# Patient Record
Sex: Female | Born: 1935 | Race: White | Hispanic: No | Marital: Married | Smoking: Never smoker
Health system: Southern US, Community
[De-identification: ages and names within clinical notes are randomized; demographics above are authoritative.]

## PROBLEM LIST (undated history)

## (undated) DIAGNOSIS — F450 Somatization disorder: Secondary | ICD-10-CM

## (undated) DIAGNOSIS — E7800 Pure hypercholesterolemia, unspecified: Secondary | ICD-10-CM

## (undated) DIAGNOSIS — H409 Unspecified glaucoma: Secondary | ICD-10-CM

## (undated) DIAGNOSIS — M940 Chondrocostal junction syndrome [Tietze]: Secondary | ICD-10-CM

## (undated) DIAGNOSIS — R053 Chronic cough: Secondary | ICD-10-CM

## (undated) DIAGNOSIS — I447 Left bundle-branch block, unspecified: Secondary | ICD-10-CM

## (undated) DIAGNOSIS — R05 Cough: Secondary | ICD-10-CM

## (undated) DIAGNOSIS — K219 Gastro-esophageal reflux disease without esophagitis: Secondary | ICD-10-CM

## (undated) DIAGNOSIS — E222 Syndrome of inappropriate secretion of antidiuretic hormone: Secondary | ICD-10-CM

## (undated) DIAGNOSIS — J309 Allergic rhinitis, unspecified: Secondary | ICD-10-CM

## (undated) DIAGNOSIS — F329 Major depressive disorder, single episode, unspecified: Secondary | ICD-10-CM

## (undated) DIAGNOSIS — I10 Essential (primary) hypertension: Secondary | ICD-10-CM

## (undated) DIAGNOSIS — M810 Age-related osteoporosis without current pathological fracture: Secondary | ICD-10-CM

## (undated) DIAGNOSIS — R413 Other amnesia: Secondary | ICD-10-CM

## (undated) HISTORY — PX: CATARACT EXTRACTION: SUR2

## (undated) HISTORY — DX: Other amnesia: R41.3

## (undated) HISTORY — DX: Somatization disorder: F45.0

## (undated) HISTORY — PX: ABDOMINAL SURGERY: SHX537

## (undated) HISTORY — DX: Allergic rhinitis, unspecified: J30.9

## (undated) HISTORY — DX: Chondrocostal junction syndrome (tietze): M94.0

## (undated) HISTORY — DX: Pure hypercholesterolemia, unspecified: E78.00

## (undated) HISTORY — DX: Syndrome of inappropriate secretion of antidiuretic hormone: E22.2

## (undated) HISTORY — DX: Chronic cough: R05.3

## (undated) HISTORY — DX: Gastro-esophageal reflux disease without esophagitis: K21.9

## (undated) HISTORY — DX: Major depressive disorder, single episode, unspecified: F32.9

## (undated) HISTORY — DX: Age-related osteoporosis without current pathological fracture: M81.0

## (undated) HISTORY — DX: Cough: R05

## (undated) HISTORY — DX: Left bundle-branch block, unspecified: I44.7

## (undated) HISTORY — PX: APPENDECTOMY: SHX54

---

## 1998-05-14 ENCOUNTER — Other Ambulatory Visit: Admission: RE | Admit: 1998-05-14 | Discharge: 1998-05-14 | Payer: Self-pay | Admitting: Geriatric Medicine

## 1999-05-30 ENCOUNTER — Encounter: Admission: RE | Admit: 1999-05-30 | Discharge: 1999-05-30 | Payer: Self-pay | Admitting: Geriatric Medicine

## 1999-05-30 ENCOUNTER — Encounter: Payer: Self-pay | Admitting: Geriatric Medicine

## 1999-07-03 ENCOUNTER — Encounter: Payer: Self-pay | Admitting: Geriatric Medicine

## 1999-07-03 ENCOUNTER — Encounter: Admission: RE | Admit: 1999-07-03 | Discharge: 1999-07-03 | Payer: Self-pay | Admitting: Geriatric Medicine

## 2000-01-05 ENCOUNTER — Encounter: Payer: Self-pay | Admitting: Gastroenterology

## 2000-01-05 ENCOUNTER — Ambulatory Visit (HOSPITAL_COMMUNITY): Admission: RE | Admit: 2000-01-05 | Discharge: 2000-01-05 | Payer: Self-pay | Admitting: Gastroenterology

## 2000-02-16 ENCOUNTER — Ambulatory Visit (HOSPITAL_COMMUNITY): Admission: RE | Admit: 2000-02-16 | Discharge: 2000-02-16 | Payer: Self-pay | Admitting: Gastroenterology

## 2000-08-16 ENCOUNTER — Inpatient Hospital Stay (HOSPITAL_COMMUNITY): Admission: RE | Admit: 2000-08-16 | Discharge: 2000-08-19 | Payer: Self-pay | Admitting: Surgery

## 2000-10-19 ENCOUNTER — Encounter: Payer: Self-pay | Admitting: Geriatric Medicine

## 2000-10-19 ENCOUNTER — Encounter: Admission: RE | Admit: 2000-10-19 | Discharge: 2000-10-19 | Payer: Self-pay | Admitting: Geriatric Medicine

## 2000-11-01 ENCOUNTER — Other Ambulatory Visit: Admission: RE | Admit: 2000-11-01 | Discharge: 2000-11-01 | Payer: Self-pay | Admitting: Geriatric Medicine

## 2000-11-08 ENCOUNTER — Encounter: Payer: Self-pay | Admitting: Surgery

## 2000-11-08 ENCOUNTER — Ambulatory Visit (HOSPITAL_COMMUNITY): Admission: RE | Admit: 2000-11-08 | Discharge: 2000-11-08 | Payer: Self-pay | Admitting: Surgery

## 2000-12-13 ENCOUNTER — Emergency Department (HOSPITAL_COMMUNITY): Admission: EM | Admit: 2000-12-13 | Discharge: 2000-12-13 | Payer: Self-pay | Admitting: Emergency Medicine

## 2001-05-25 ENCOUNTER — Ambulatory Visit (HOSPITAL_COMMUNITY): Admission: RE | Admit: 2001-05-25 | Discharge: 2001-05-25 | Payer: Self-pay | Admitting: Specialist

## 2001-06-20 ENCOUNTER — Ambulatory Visit (HOSPITAL_COMMUNITY): Admission: RE | Admit: 2001-06-20 | Discharge: 2001-06-20 | Payer: Self-pay | Admitting: Specialist

## 2001-09-06 ENCOUNTER — Encounter: Payer: Self-pay | Admitting: Emergency Medicine

## 2001-09-06 ENCOUNTER — Emergency Department (HOSPITAL_COMMUNITY): Admission: EM | Admit: 2001-09-06 | Discharge: 2001-09-06 | Payer: Self-pay | Admitting: Emergency Medicine

## 2001-09-15 ENCOUNTER — Emergency Department (HOSPITAL_COMMUNITY): Admission: EM | Admit: 2001-09-15 | Discharge: 2001-09-15 | Payer: Self-pay | Admitting: *Deleted

## 2001-10-31 ENCOUNTER — Encounter: Payer: Self-pay | Admitting: Geriatric Medicine

## 2001-10-31 ENCOUNTER — Encounter: Admission: RE | Admit: 2001-10-31 | Discharge: 2001-10-31 | Payer: Self-pay | Admitting: Geriatric Medicine

## 2002-01-03 HISTORY — PX: COLONOSCOPY: SHX174

## 2002-01-25 ENCOUNTER — Ambulatory Visit (HOSPITAL_COMMUNITY): Admission: RE | Admit: 2002-01-25 | Discharge: 2002-01-25 | Payer: Self-pay | Admitting: Gastroenterology

## 2002-11-02 ENCOUNTER — Encounter: Payer: Self-pay | Admitting: Geriatric Medicine

## 2002-11-02 ENCOUNTER — Encounter: Admission: RE | Admit: 2002-11-02 | Discharge: 2002-11-02 | Payer: Self-pay | Admitting: Geriatric Medicine

## 2003-03-28 ENCOUNTER — Encounter: Payer: Self-pay | Admitting: Emergency Medicine

## 2003-03-28 ENCOUNTER — Emergency Department (HOSPITAL_COMMUNITY): Admission: EM | Admit: 2003-03-28 | Discharge: 2003-03-28 | Payer: Self-pay | Admitting: Emergency Medicine

## 2003-09-23 ENCOUNTER — Emergency Department (HOSPITAL_COMMUNITY): Admission: EM | Admit: 2003-09-23 | Discharge: 2003-09-23 | Payer: Self-pay | Admitting: Emergency Medicine

## 2003-11-05 ENCOUNTER — Encounter: Admission: RE | Admit: 2003-11-05 | Discharge: 2003-11-05 | Payer: Self-pay | Admitting: Geriatric Medicine

## 2003-11-26 ENCOUNTER — Other Ambulatory Visit: Admission: RE | Admit: 2003-11-26 | Discharge: 2003-11-26 | Payer: Self-pay | Admitting: Geriatric Medicine

## 2004-01-15 ENCOUNTER — Ambulatory Visit (HOSPITAL_COMMUNITY): Admission: RE | Admit: 2004-01-15 | Discharge: 2004-01-15 | Payer: Self-pay | Admitting: Gastroenterology

## 2004-08-12 ENCOUNTER — Encounter: Admission: RE | Admit: 2004-08-12 | Discharge: 2004-08-12 | Payer: Self-pay | Admitting: Geriatric Medicine

## 2004-08-28 ENCOUNTER — Ambulatory Visit: Payer: Self-pay | Admitting: Internal Medicine

## 2004-09-11 ENCOUNTER — Ambulatory Visit: Payer: Self-pay | Admitting: Internal Medicine

## 2004-10-01 ENCOUNTER — Ambulatory Visit: Payer: Self-pay | Admitting: Internal Medicine

## 2004-10-21 ENCOUNTER — Encounter: Admission: RE | Admit: 2004-10-21 | Discharge: 2004-10-21 | Payer: Self-pay | Admitting: Gastroenterology

## 2004-10-30 ENCOUNTER — Ambulatory Visit (HOSPITAL_COMMUNITY): Admission: RE | Admit: 2004-10-30 | Discharge: 2004-10-30 | Payer: Self-pay | Admitting: Gastroenterology

## 2004-11-17 ENCOUNTER — Encounter: Admission: RE | Admit: 2004-11-17 | Discharge: 2004-11-17 | Payer: Self-pay | Admitting: Geriatric Medicine

## 2005-01-29 ENCOUNTER — Ambulatory Visit: Payer: Self-pay | Admitting: Internal Medicine

## 2005-07-25 ENCOUNTER — Encounter: Admission: RE | Admit: 2005-07-25 | Discharge: 2005-07-25 | Payer: Self-pay | Admitting: Geriatric Medicine

## 2005-11-18 ENCOUNTER — Encounter: Admission: RE | Admit: 2005-11-18 | Discharge: 2005-11-18 | Payer: Self-pay | Admitting: Geriatric Medicine

## 2007-01-14 ENCOUNTER — Encounter: Admission: RE | Admit: 2007-01-14 | Discharge: 2007-01-14 | Payer: Self-pay | Admitting: Geriatric Medicine

## 2009-04-05 HISTORY — PX: US ECHOCARDIOGRAPHY: HXRAD669

## 2010-09-15 ENCOUNTER — Other Ambulatory Visit: Payer: Self-pay | Admitting: Internal Medicine

## 2010-09-15 ENCOUNTER — Ambulatory Visit
Admission: RE | Admit: 2010-09-15 | Discharge: 2010-09-15 | Disposition: A | Discharge status: Home / Self Care | Payer: Medicare Other | Source: Ambulatory Visit | Attending: Internal Medicine | Admitting: Internal Medicine

## 2010-09-15 DIAGNOSIS — G44009 Cluster headache syndrome, unspecified, not intractable: Secondary | ICD-10-CM

## 2010-11-21 NOTE — Op Note (Signed)
NAME:  Natasha David, CIMINO NO.:  [id number]   MEDICAL RECORD NO.:  [id number]          PATIENT TYPE:  AMB   LOCATION:  ENDO                         FACILITY:  [facility]   PHYSICIAN:  Danise Edge, M.D.   DATE OF BIRTH:  04-Oct-1920   DATE OF PROCEDURE:  10/30/2004  DATE OF DISCHARGE:                                 OPERATIVE REPORT   PROCEDURE:  Esophagogastroduodenoscopy/Savary esophageal dilation.   PROCEDURE INDICATIONS:  Natasha David is an 77 year old female born  [birth year]. Natasha David underwent laparoscopic Nissen fundoplication  surgery in February 2002 to control gastroesophageal reflux. She requires no  antireflux medication and does not experience heartburn, dysphagia, or  odynophagia. When she developed a chronic cough which seemed to be worsened  with food intake, she underwent a barium swallowing study. She did not have  tracheal penetration. When she swallowed the barium tablet, it lodged at the  esophagogastric junction.   ENDOSCOPIST:  Danise Edge, M.D.   PREMEDICATION:  Versed 5 mg, Demerol 50 mg.   PROCEDURE:  After obtaining informed consent, [name] was placed in the  left lateral decubitus position on the fluoroscopy table. I administered  intravenous Demerol and intravenous Versed to achieve conscious sedation for  the procedure. The patient's blood pressure, oxygen saturation and cardiac  rhythm were monitored throughout the procedure and documented in the medical  record.   The Olympus gastroscope was passed through the posterior hypopharynx into  the proximal esophagus without difficulty. The hypopharynx, larynx and vocal  cords appeared normal.   Esophagoscopy: The proximal mid and lower segments of the esophageal mucosa  appeared normal. She appears to have a shallow stricture at the  esophagogastric junction which appears benign.   Gastroscopy: Retroflexed view of the gastric cardia and fundus was normal.  The  fundoplication is intact and tightly hugging the gastroscope. The  gastric fundus and body appeared normal. The gastric cardia and fundus  appeared normal. The gastric body, antrum and pylorus appeared normal.   Duodenoscopy: The duodenal bulb and descending duodenum appeared normal.   Savary esophageal dilation: The Savary wire was passed through the  gastroscope and tip of the guidewire advanced to the distal gastric antrum  as confirmed both endoscopically and fluoroscopically. Under fluoroscopic  guidance, the 15-mm Savary dilator passed without resistance. Repeat  esophagogastroscopy confirmed dilation of the benign peptic stricture at the  esophagogastric junction and no gastric trauma secondary to the wire.   ASSESSMENT:  1.  Intact laparoscopic Nissen fundoplication.  2.  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  3.  A 15-mm  Savary dilation of the benign peptic stricture at the      esophagogastric junction.   RECOMMENDATIONS:  I have no explanation for Natasha David's chronic cough. I do  not demonstrate that she has tracheal aspiration. I do not think she has  acid reflux as a cause for her chronic cough.   She is currently using Tussionex which seems  to be controlling her cough.      MJ/MEDQ  D:  10/30/2004  T:  10/30/2004  Job:  [id number]   cc:   Hal T. Stoneking, M.D.  [address]  [place], [place] [address]  Fax: [phone]   Clinton D. Maple Hudson, M.D.

## 2010-11-21 NOTE — Op Note (Signed)
[facility]  Patient:    Natasha David, Natasha David                   MRN: [id number] Proc. Date: 08/16/00 Attending:  Thornton Park. Daphine Deutscher, M.D. CC:         Hal T. Stoneking, M.D.  Charolett Bumpers III, M.D.   Operative Report  PREOPERATIVE DIAGNOSIS:  Gastroesophageal reflux disease.  POSTOPERATIVE DIAGNOSIS:  Gastroesophageal reflux disease.  OPERATION:  Laparoscopic Nissen fundoplication and closure of the crura.  SURGEON:  Thornton Park. Daphine Deutscher, M.D.  ASSISTANT:  Angelia Mould. Derrell Lolling, M.D.  ANESTHESIA:  General endotracheal anesthesia.  DESCRIPTION OF PROCEDURE:  Natasha David is a 77 year old lady who I had seen on numerous occasions and discussed laparoscopic as well as open Nissen fundoplication with her and her husband.  She has esophageal pain and a lot of problems with material refluxing and with coughing, particularly at night despite elevating the head of her bed.  Her heartburn seems to be well controlled with proton pump inhibitors.  Preoperatively we obtained a manometry, and then she also had an upper GI which did show a small hiatal hernia with some reflux.  She was taken to OR #1 and given general anesthesia.  The abdomen was prepped widely with Betadine and draped sterilely.  She had a perforated appendix with peritonitis and a bowel obstruction in the 1970s, and she had a wide paramedian incision.  This complicated our initial entry.  I made the supraumbilical incision just medial to part of her paramedian incision and went in bluntly, and, through a pursestring suture, passed the Hasson cannula and was able to insufflate the abdomen but was unable to get in with the angled scope.  With the abdomen insufflated, however, I used the 1011 and the 0 degree scope on the end to come into the right upper quadrant and enter the free space there.  I put a 5 mm in the upper midline which I subsequently used to try to retract the liver and then began  taking down some of these adhesions with the harmonic scalpel.  Between that trocar and a subsequent one in the left upper quadrant using the 1011, I was able to mobilize all these adhesions and free them all the way around down to well below the umbilicus.  She had a pretty significant inflammatory reaction in her lower abdomen from the surgery in the 1970s.  The left lateral segment was somewhat floppy and hanging down really lower. Her anatomy was complicated by a short distance from the umbilicus to the diaphragm, and the liver itself did not lend itself to easy retraction.  It also looked like she probably had a replaced left hepatic coming off the gastric which we preserved.  With the harmonic scalpel, I opened the gastrohepatic window.  I had to eventually use just the 5 mm retractor coming from above and even just used the trocar itself to retract the left lateral segment.  I carried this up and found a very acute angled wide crus which was attenuated.  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  I carried this anteriorly and then onto the left crus.  I went over and took down short gastrics and mobilized the cardia nicely and mobilized my retroesophageal window.  Again, the crus was found to be at a fairly acute angle, and I got around the GE junction, creating a nice window there.  I then ended up closing the esophageal crura with two stitches using the EndoStitch.  The left crus was right on top of the aorta and, again, I had to close it more on the patients right side just because of the angle but got two stitches to approximating and snugged down the crura somewhat.  Again, the attenuation of the right crus did not lend itself to as sturdy a closure as usual.  However, this was closed.  With that, then, we passed the 50 lighted dilator which passed easily.  I then had in the meantime reached around and gotten a  segment of cardia and fundus which came around easily.  This was sutured to a contiguous segment of the wrapped portion over on the left side and taking purchases of stomach and then esophagus and then stomach and tied down extracorporeally.  Three such knots were placed to secure the wrap.  Clips were placed on all the knots.  The bougie was removed.  The crus was snug, but it did not appear to be too tight.  There was no evidence of bleeding or any kind of enterotomies. Everything looked good.  The anatomy was returned to its anatomic position with the liver down on top of the GE junction.  I surveyed all the trocar holes and where I had taken down the adhesions, and there was no evidence of any enterotomies.  The umbilical port was tied down with the previously placed pursestring suture.  The other ports were withdrawn.  All the ports were injected with Marcaine, and the skin was closed with 4-0 Vicryl with Benzoin and Steri-Strips.  The patient seemed to tolerate the procedure well and was taken to the recovery room in satisfactory condition.DD:  08/16/00 TD:  08/16/00 Job: [id number]

## 2010-11-21 NOTE — Discharge Summary (Signed)
[facility]  Patient:    Natasha David, Natasha David                   MRN: [id number] Adm. Date:  [id number] Disc. Date: 08/19/00 Attending:  Katha Cabal CC:         Hal T. Stoneking, M.D.  Charolett Bumpers III, M.D.   Discharge Summary  ADMISSION DIAGNOSIS:  Gastroesophageal reflux disease.  PROCEDURE:  Laparoscopic Nissen fundoplication and closure of crura.  COURSE IN THE HOSPITAL:  Natasha David was admitted on August 16, 2000, and underwent a laparoscopic Nissen fundoplication over a #50 dilator with closure of the crura.  Postoperatively, she did well.  She was begun on water and then advanced to liquids.  She was ready for discharge on August 19, 2000.  DISCHARGE MEDICATIONS:  She was given a prescription for Mepergan Fortis to take for pain.  DIET:  She was given instruction in advancing her diet slowly from a soft diet gradually to a solid diet.  FOLLOW-UP:  She will be seen in the office in two to three weeks in follow-up.  CONDITION ON DISCHARGE:  Good. DD:  08/19/00 TD:  08/19/00 Job: [id number]

## 2010-11-21 NOTE — H&P (Signed)
[facility]  Patient:    Natasha David, Natasha David                   MRN: [id number] Adm. Date:  08/16/00 Attending:  Thornton Park. Daphine Deutscher, M.D.                         History and Physical  CHIEF COMPLAINT:  Chronic gastroesophageal reflux disease.  HISTORY OF PRESENT ILLNESS:  Natasha David is a 77 year old lady followed by Hal T. Pete Glatter, M.D. and ______  Laural Benes, M.D. with chronic gastroesophageal reflux and regurgitation. She has had a lot of problems with chronic cough, but her symptoms of heartburn are well controlled with her Nexium. Her chronic cough and water brash have been a problem even though she does sleep with the head of her bed elevated. I have discussed this with her and husband on numerous occasions. We did acquire an esophageal manometry which revealed adequate peristalsis preoperatively. She had an upper GI which showed a small hiatal hernia with some reflux and a slight prominence of the cricopharygeal muscle.  ALLERGIES:  PENICILLIN and ERYTHROMYCIN.  CURRENT MEDICATIONS:  Hydrochlorothiazide, Nexium, Norvasc 5 mg q.d., Miacalcin Nasal Spray, vitamin E, Cosopt glaucoma drops 10 cc b.i.d.  PAST SURGICAL HISTORY:  Appendicitis in 1974 with rupture and followed by intestinal obstruction in 1976.  MEDICATIONS PROBLEMS:  GERD, vertigo, osteoporosis, mitral valve prolapse, glaucoma, and hypertension.  SOCIAL HISTORY:  The patient does not smoke or drink.  FAMILY HISTORY:  Remarkable in that her father died of leukemia, and her mother died of a heart attack and also had diabetes.  REVIEW OF SYSTEMS:  Positive for glasses and glaucoma, some hearing loss, mitral valve prolapse, reflux, high cholesterol.  PHYSICAL EXAMINATION:  GENERAL:  An older-appearing lady.  HEENT:  Unremarkable.  NECK:  Supple.  CHEST:  Clear.  HEART:  Sinus rhythm  ABDOMEN:  Flat and there is a right paramedian incision with an old scar.  EXTREMITIES:   Full range of motion.  NEUROLOGICAL:  Alert and oriented x 3. Motor and sensory function grossly intact.  IMPRESSION:  Gastroesophageal reflux disease.  PLAN:  Laparoscopic Nissen fundoplication. DD:  08/16/00 TD:  08/16/00 Job: [id number]

## 2010-11-21 NOTE — Procedures (Signed)
[facility]  Patient:    Natasha David, Natasha David Visit Number: [id number] MRN: [id number]          Service Type: END Location: ENDO Attending Physician:  Dennison Bulla Ii Dictated by:   Verlin Grills, M.D. Proc. Date: 01/25/02 Admit Date:  01/25/2002 Discharge Date: 01/25/2002   CC:         Hal T. Stoneking, M.D.   Procedure Report  PROCEDURE:  Diagnostic colonoscopy.  REFERRING PHYSICIAN:  Hal T. Stoneking, M.D.  PROCEDURE INDICATION:  Natasha David is a 78 year old female born 05-Apr-1921.  Natasha David is scheduled to undergo a diagnostic colonoscopy to evaluate guaiac positive stool.  In 2002, she underwent a laparoscopic Nissen fundoplication.  I discussed with Natasha David the complications associated with colonoscopy and polypectomy, including a 15 per 1000 risk of bleeding and 4 per 1000 risk of colon perforation requiring surgical repair.  Natasha David has signed the operative permit.  ENDOSCOPIST:  Verlin Grills, M.D.  PREMEDICATION:  Versed 3.5 mg, Demerol 50 mg.  ENDOSCOPE:  Olympus pediatric colonoscope.  DESCRIPTION OF PROCEDURE:  After obtaining informed consent, Natasha David was placed in the left lateral decubitus position.  I administered intravenous Demerol and intravenous Versed to achieve conscious sedation for the procedure.  The patients blood pressure, oxygen saturation, and cardiac rhythm were monitored throughout the procedure and documented in the medical record.  Anal inspection was normal.  Digital rectal exam was normal.  The Olympus pediatric video colonoscope was introduced into the rectum and advanced to the cecum.  A normal-appearing ileocecal valve was intubated and the distal ileum inspected.  Colonic preparation for the exam today was excellent.  RECTUM:  Normal.  SIGMOID COLON AND DESCENDING COLON:  Normal.  SPLENIC FLEXURE:  Normal.  TRANSVERSE COLON:  Normal.  HEPATIC FLEXURE:   Normal.  ASCENDING COLON:  Normal.  CECUM AND ILEOCECAL VALVE:  Normal.  DISTAL ILEUM:  Normal.  ASSESSMENT:  Normal proctocolonoscopy to the cecum with distal ileoscopy. Dictated by:   Verlin Grills, M.D. Attending Physician:  Dennison Bulla Ii DD:  01/25/02 TD:  01/28/02 Job: [id number]

## 2010-11-21 NOTE — Procedures (Signed)
[name]  Patient:    Natasha David, Natasha David                         MRN: [id number] Proc. Date: 02/16/00 Attending:  Verlin Grills, M.D.                           Procedure Report  PROCEDURE PERFORMED:  Esophageal manometry.  ENDOSCOPIST:  Verlin Grills, M.D.  INDICATIONS FOR PROCEDURE:  The patient is a  77 year old female with gastroesophageal reflux symptoms (heartburn, chest pain), who is being evaluated for laparoscopic antireflux surgery.  She chronically takes Cosopt, hydrochlorothiazide, Norvasc, Protonix and miacalcin.  Lower esophageal sphincter:  The resting lower esophageal sphincter pressure was 48.6 mmHg which is in the upper limits of normal.  There is 89% relaxation for 9.5 seconds of the lower esophageal sphincter with swallows.  Lower esophageal body:  Data obtained from the lower esophageal body was measured based on 10 wet swallows.  The distal esophageal wave amplitude was 88 mmHg which is normal.  90% of postswallow esophageal contractions were peristaltic and 10% were retrograde contraction.  Upper esophageal body:  Five wet swallows resulted in 100% peristaltic contraction with normal wave amplitude.  IMPRESSION: Slightly high resting lower esophageal sphincter pressure with normal swallow associated relaxation.  Normal esophageal body function.  Based on esophageal manometry, Natasha David should be a satisfactory candidate for laparoscopic antireflux surgery. DD:  02/20/00 TD:  02/22/00 Job: [id number]

## 2011-02-11 ENCOUNTER — Other Ambulatory Visit: Payer: Self-pay | Admitting: Geriatric Medicine

## 2011-02-11 DIAGNOSIS — R51 Headache: Secondary | ICD-10-CM

## 2011-02-12 ENCOUNTER — Ambulatory Visit
Admission: RE | Admit: 2011-02-12 | Discharge: 2011-02-12 | Disposition: A | Discharge status: Home / Self Care | Payer: Medicare Other | Source: Ambulatory Visit | Attending: Geriatric Medicine | Admitting: Geriatric Medicine

## 2011-02-12 DIAGNOSIS — R51 Headache: Secondary | ICD-10-CM

## 2011-07-28 DIAGNOSIS — H02059 Trichiasis without entropian unspecified eye, unspecified eyelid: Secondary | ICD-10-CM | POA: Diagnosis not present

## 2011-07-28 DIAGNOSIS — H05229 Edema of unspecified orbit: Secondary | ICD-10-CM | POA: Diagnosis not present

## 2011-07-30 DIAGNOSIS — H02059 Trichiasis without entropian unspecified eye, unspecified eyelid: Secondary | ICD-10-CM | POA: Diagnosis not present

## 2011-08-06 DIAGNOSIS — I10 Essential (primary) hypertension: Secondary | ICD-10-CM | POA: Diagnosis not present

## 2011-08-06 DIAGNOSIS — R269 Unspecified abnormalities of gait and mobility: Secondary | ICD-10-CM | POA: Diagnosis not present

## 2011-08-11 ENCOUNTER — Ambulatory Visit: Payer: Medicare Other | Attending: Geriatric Medicine | Admitting: Physical Therapy

## 2011-08-11 DIAGNOSIS — M6281 Muscle weakness (generalized): Secondary | ICD-10-CM | POA: Diagnosis not present

## 2011-08-11 DIAGNOSIS — R5381 Other malaise: Secondary | ICD-10-CM | POA: Diagnosis not present

## 2011-08-11 DIAGNOSIS — IMO0001 Reserved for inherently not codable concepts without codable children: Secondary | ICD-10-CM | POA: Insufficient documentation

## 2011-08-11 DIAGNOSIS — R262 Difficulty in walking, not elsewhere classified: Secondary | ICD-10-CM | POA: Insufficient documentation

## 2011-08-13 ENCOUNTER — Ambulatory Visit: Payer: Medicare Other | Admitting: Physical Therapy

## 2011-08-13 DIAGNOSIS — R5381 Other malaise: Secondary | ICD-10-CM | POA: Diagnosis not present

## 2011-08-13 DIAGNOSIS — IMO0001 Reserved for inherently not codable concepts without codable children: Secondary | ICD-10-CM | POA: Diagnosis not present

## 2011-08-13 DIAGNOSIS — R262 Difficulty in walking, not elsewhere classified: Secondary | ICD-10-CM | POA: Diagnosis not present

## 2011-08-13 DIAGNOSIS — M6281 Muscle weakness (generalized): Secondary | ICD-10-CM | POA: Diagnosis not present

## 2011-08-17 ENCOUNTER — Ambulatory Visit: Payer: Medicare Other | Admitting: Physical Therapy

## 2011-08-17 DIAGNOSIS — M6281 Muscle weakness (generalized): Secondary | ICD-10-CM | POA: Diagnosis not present

## 2011-08-17 DIAGNOSIS — R5381 Other malaise: Secondary | ICD-10-CM | POA: Diagnosis not present

## 2011-08-17 DIAGNOSIS — R262 Difficulty in walking, not elsewhere classified: Secondary | ICD-10-CM | POA: Diagnosis not present

## 2011-08-17 DIAGNOSIS — IMO0001 Reserved for inherently not codable concepts without codable children: Secondary | ICD-10-CM | POA: Diagnosis not present

## 2011-08-19 DIAGNOSIS — Z79899 Other long term (current) drug therapy: Secondary | ICD-10-CM | POA: Diagnosis not present

## 2011-08-19 DIAGNOSIS — E871 Hypo-osmolality and hyponatremia: Secondary | ICD-10-CM | POA: Diagnosis not present

## 2011-08-19 DIAGNOSIS — I10 Essential (primary) hypertension: Secondary | ICD-10-CM | POA: Diagnosis not present

## 2011-08-19 DIAGNOSIS — R42 Dizziness and giddiness: Secondary | ICD-10-CM | POA: Diagnosis not present

## 2011-08-20 ENCOUNTER — Ambulatory Visit: Payer: Medicare Other | Admitting: Physical Therapy

## 2011-08-21 DIAGNOSIS — H4011X0 Primary open-angle glaucoma, stage unspecified: Secondary | ICD-10-CM | POA: Diagnosis not present

## 2011-08-21 DIAGNOSIS — H409 Unspecified glaucoma: Secondary | ICD-10-CM | POA: Diagnosis not present

## 2011-08-24 ENCOUNTER — Ambulatory Visit: Payer: Medicare Other | Admitting: Physical Therapy

## 2011-08-27 ENCOUNTER — Ambulatory Visit: Payer: Medicare Other | Admitting: Physical Therapy

## 2011-09-23 DIAGNOSIS — R51 Headache: Secondary | ICD-10-CM | POA: Diagnosis not present

## 2011-09-23 DIAGNOSIS — R42 Dizziness and giddiness: Secondary | ICD-10-CM | POA: Diagnosis not present

## 2011-09-28 DIAGNOSIS — H02059 Trichiasis without entropian unspecified eye, unspecified eyelid: Secondary | ICD-10-CM | POA: Diagnosis not present

## 2011-09-28 DIAGNOSIS — H5710 Ocular pain, unspecified eye: Secondary | ICD-10-CM | POA: Diagnosis not present

## 2011-09-28 DIAGNOSIS — R42 Dizziness and giddiness: Secondary | ICD-10-CM | POA: Diagnosis not present

## 2011-10-12 DIAGNOSIS — H5710 Ocular pain, unspecified eye: Secondary | ICD-10-CM | POA: Diagnosis not present

## 2011-10-12 DIAGNOSIS — H02059 Trichiasis without entropian unspecified eye, unspecified eyelid: Secondary | ICD-10-CM | POA: Diagnosis not present

## 2011-11-09 DIAGNOSIS — H02059 Trichiasis without entropian unspecified eye, unspecified eyelid: Secondary | ICD-10-CM | POA: Diagnosis not present

## 2011-11-27 DIAGNOSIS — M76899 Other specified enthesopathies of unspecified lower limb, excluding foot: Secondary | ICD-10-CM | POA: Diagnosis not present

## 2011-11-27 DIAGNOSIS — I10 Essential (primary) hypertension: Secondary | ICD-10-CM | POA: Diagnosis not present

## 2011-12-07 DIAGNOSIS — M25569 Pain in unspecified knee: Secondary | ICD-10-CM | POA: Diagnosis not present

## 2011-12-07 DIAGNOSIS — M25559 Pain in unspecified hip: Secondary | ICD-10-CM | POA: Diagnosis not present

## 2011-12-08 DIAGNOSIS — D692 Other nonthrombocytopenic purpura: Secondary | ICD-10-CM | POA: Diagnosis not present

## 2011-12-08 DIAGNOSIS — L570 Actinic keratosis: Secondary | ICD-10-CM | POA: Diagnosis not present

## 2011-12-21 DIAGNOSIS — M25569 Pain in unspecified knee: Secondary | ICD-10-CM | POA: Diagnosis not present

## 2011-12-22 DIAGNOSIS — H02059 Trichiasis without entropian unspecified eye, unspecified eyelid: Secondary | ICD-10-CM | POA: Diagnosis not present

## 2012-01-12 DIAGNOSIS — H02059 Trichiasis without entropian unspecified eye, unspecified eyelid: Secondary | ICD-10-CM | POA: Diagnosis not present

## 2012-01-26 DIAGNOSIS — H02059 Trichiasis without entropian unspecified eye, unspecified eyelid: Secondary | ICD-10-CM | POA: Diagnosis not present

## 2012-01-26 DIAGNOSIS — H04129 Dry eye syndrome of unspecified lacrimal gland: Secondary | ICD-10-CM | POA: Diagnosis not present

## 2012-02-17 DIAGNOSIS — M810 Age-related osteoporosis without current pathological fracture: Secondary | ICD-10-CM | POA: Diagnosis not present

## 2012-02-17 DIAGNOSIS — Z1331 Encounter for screening for depression: Secondary | ICD-10-CM | POA: Diagnosis not present

## 2012-02-17 DIAGNOSIS — Z0000 Encounter for general adult medical examination without abnormal findings: Secondary | ICD-10-CM | POA: Diagnosis not present

## 2012-02-17 DIAGNOSIS — Z79899 Other long term (current) drug therapy: Secondary | ICD-10-CM | POA: Diagnosis not present

## 2012-03-01 DIAGNOSIS — H409 Unspecified glaucoma: Secondary | ICD-10-CM | POA: Diagnosis not present

## 2012-03-01 DIAGNOSIS — H02059 Trichiasis without entropian unspecified eye, unspecified eyelid: Secondary | ICD-10-CM | POA: Diagnosis not present

## 2012-03-01 DIAGNOSIS — H4011X0 Primary open-angle glaucoma, stage unspecified: Secondary | ICD-10-CM | POA: Diagnosis not present

## 2012-03-10 DIAGNOSIS — Z23 Encounter for immunization: Secondary | ICD-10-CM | POA: Diagnosis not present

## 2012-03-23 DIAGNOSIS — D485 Neoplasm of uncertain behavior of skin: Secondary | ICD-10-CM | POA: Diagnosis not present

## 2012-03-23 DIAGNOSIS — L821 Other seborrheic keratosis: Secondary | ICD-10-CM | POA: Diagnosis not present

## 2012-03-23 DIAGNOSIS — Z85828 Personal history of other malignant neoplasm of skin: Secondary | ICD-10-CM | POA: Diagnosis not present

## 2012-03-23 DIAGNOSIS — L570 Actinic keratosis: Secondary | ICD-10-CM | POA: Diagnosis not present

## 2012-04-11 DIAGNOSIS — H02059 Trichiasis without entropian unspecified eye, unspecified eyelid: Secondary | ICD-10-CM | POA: Diagnosis not present

## 2012-05-05 DIAGNOSIS — H02059 Trichiasis without entropian unspecified eye, unspecified eyelid: Secondary | ICD-10-CM | POA: Diagnosis not present

## 2012-05-16 DIAGNOSIS — H02059 Trichiasis without entropian unspecified eye, unspecified eyelid: Secondary | ICD-10-CM | POA: Diagnosis not present

## 2012-05-30 DIAGNOSIS — M25569 Pain in unspecified knee: Secondary | ICD-10-CM | POA: Diagnosis not present

## 2012-05-30 DIAGNOSIS — M76899 Other specified enthesopathies of unspecified lower limb, excluding foot: Secondary | ICD-10-CM | POA: Diagnosis not present

## 2012-06-06 DIAGNOSIS — H02059 Trichiasis without entropian unspecified eye, unspecified eyelid: Secondary | ICD-10-CM | POA: Diagnosis not present

## 2012-07-05 DIAGNOSIS — H409 Unspecified glaucoma: Secondary | ICD-10-CM | POA: Diagnosis not present

## 2012-07-05 DIAGNOSIS — H02059 Trichiasis without entropian unspecified eye, unspecified eyelid: Secondary | ICD-10-CM | POA: Diagnosis not present

## 2012-07-05 DIAGNOSIS — H4011X0 Primary open-angle glaucoma, stage unspecified: Secondary | ICD-10-CM | POA: Diagnosis not present

## 2012-07-27 DIAGNOSIS — H02059 Trichiasis without entropian unspecified eye, unspecified eyelid: Secondary | ICD-10-CM | POA: Diagnosis not present

## 2012-08-16 DIAGNOSIS — H02059 Trichiasis without entropian unspecified eye, unspecified eyelid: Secondary | ICD-10-CM | POA: Diagnosis not present

## 2012-08-24 DIAGNOSIS — I10 Essential (primary) hypertension: Secondary | ICD-10-CM | POA: Diagnosis not present

## 2012-08-24 DIAGNOSIS — M810 Age-related osteoporosis without current pathological fracture: Secondary | ICD-10-CM | POA: Diagnosis not present

## 2012-09-13 DIAGNOSIS — Z79899 Other long term (current) drug therapy: Secondary | ICD-10-CM | POA: Diagnosis not present

## 2012-09-14 DIAGNOSIS — H02059 Trichiasis without entropian unspecified eye, unspecified eyelid: Secondary | ICD-10-CM | POA: Diagnosis not present

## 2012-09-14 DIAGNOSIS — H5710 Ocular pain, unspecified eye: Secondary | ICD-10-CM | POA: Diagnosis not present

## 2012-09-19 DIAGNOSIS — H6120 Impacted cerumen, unspecified ear: Secondary | ICD-10-CM | POA: Diagnosis not present

## 2012-09-19 DIAGNOSIS — I10 Essential (primary) hypertension: Secondary | ICD-10-CM | POA: Diagnosis not present

## 2012-09-21 DIAGNOSIS — H4011X0 Primary open-angle glaucoma, stage unspecified: Secondary | ICD-10-CM | POA: Diagnosis not present

## 2012-09-21 DIAGNOSIS — H02059 Trichiasis without entropian unspecified eye, unspecified eyelid: Secondary | ICD-10-CM | POA: Diagnosis not present

## 2012-09-21 DIAGNOSIS — H409 Unspecified glaucoma: Secondary | ICD-10-CM | POA: Diagnosis not present

## 2012-09-23 DIAGNOSIS — H4011X0 Primary open-angle glaucoma, stage unspecified: Secondary | ICD-10-CM | POA: Diagnosis not present

## 2012-09-23 DIAGNOSIS — H409 Unspecified glaucoma: Secondary | ICD-10-CM | POA: Diagnosis not present

## 2012-09-27 DIAGNOSIS — H93299 Other abnormal auditory perceptions, unspecified ear: Secondary | ICD-10-CM | POA: Diagnosis not present

## 2012-09-27 DIAGNOSIS — H9319 Tinnitus, unspecified ear: Secondary | ICD-10-CM | POA: Diagnosis not present

## 2012-09-27 DIAGNOSIS — H6120 Impacted cerumen, unspecified ear: Secondary | ICD-10-CM | POA: Diagnosis not present

## 2012-09-27 DIAGNOSIS — H903 Sensorineural hearing loss, bilateral: Secondary | ICD-10-CM | POA: Diagnosis not present

## 2012-09-27 DIAGNOSIS — H81319 Aural vertigo, unspecified ear: Secondary | ICD-10-CM | POA: Diagnosis not present

## 2012-10-05 DIAGNOSIS — L259 Unspecified contact dermatitis, unspecified cause: Secondary | ICD-10-CM | POA: Diagnosis not present

## 2012-10-05 DIAGNOSIS — Z85828 Personal history of other malignant neoplasm of skin: Secondary | ICD-10-CM | POA: Diagnosis not present

## 2012-10-12 DIAGNOSIS — Z85828 Personal history of other malignant neoplasm of skin: Secondary | ICD-10-CM | POA: Diagnosis not present

## 2012-10-12 DIAGNOSIS — D485 Neoplasm of uncertain behavior of skin: Secondary | ICD-10-CM | POA: Diagnosis not present

## 2012-10-12 DIAGNOSIS — R21 Rash and other nonspecific skin eruption: Secondary | ICD-10-CM | POA: Diagnosis not present

## 2012-10-25 DIAGNOSIS — H4011X0 Primary open-angle glaucoma, stage unspecified: Secondary | ICD-10-CM | POA: Diagnosis not present

## 2012-10-25 DIAGNOSIS — H409 Unspecified glaucoma: Secondary | ICD-10-CM | POA: Diagnosis not present

## 2012-10-25 DIAGNOSIS — H02059 Trichiasis without entropian unspecified eye, unspecified eyelid: Secondary | ICD-10-CM | POA: Diagnosis not present

## 2012-10-31 DIAGNOSIS — I10 Essential (primary) hypertension: Secondary | ICD-10-CM | POA: Diagnosis not present

## 2012-10-31 DIAGNOSIS — R059 Cough, unspecified: Secondary | ICD-10-CM | POA: Diagnosis not present

## 2012-10-31 DIAGNOSIS — R05 Cough: Secondary | ICD-10-CM | POA: Diagnosis not present

## 2012-11-02 DIAGNOSIS — L719 Rosacea, unspecified: Secondary | ICD-10-CM | POA: Diagnosis not present

## 2012-11-02 DIAGNOSIS — L570 Actinic keratosis: Secondary | ICD-10-CM | POA: Diagnosis not present

## 2012-11-02 DIAGNOSIS — Z85828 Personal history of other malignant neoplasm of skin: Secondary | ICD-10-CM | POA: Diagnosis not present

## 2012-11-02 DIAGNOSIS — I776 Arteritis, unspecified: Secondary | ICD-10-CM | POA: Diagnosis not present

## 2012-11-14 DIAGNOSIS — Z85828 Personal history of other malignant neoplasm of skin: Secondary | ICD-10-CM | POA: Diagnosis not present

## 2012-11-14 DIAGNOSIS — T148 Other injury of unspecified body region: Secondary | ICD-10-CM | POA: Diagnosis not present

## 2012-11-14 DIAGNOSIS — L570 Actinic keratosis: Secondary | ICD-10-CM | POA: Diagnosis not present

## 2012-11-14 DIAGNOSIS — W57XXXA Bitten or stung by nonvenomous insect and other nonvenomous arthropods, initial encounter: Secondary | ICD-10-CM | POA: Diagnosis not present

## 2012-11-15 DIAGNOSIS — H02059 Trichiasis without entropian unspecified eye, unspecified eyelid: Secondary | ICD-10-CM | POA: Diagnosis not present

## 2012-11-15 DIAGNOSIS — H4011X0 Primary open-angle glaucoma, stage unspecified: Secondary | ICD-10-CM | POA: Diagnosis not present

## 2012-11-15 DIAGNOSIS — H409 Unspecified glaucoma: Secondary | ICD-10-CM | POA: Diagnosis not present

## 2012-12-06 DIAGNOSIS — H4011X0 Primary open-angle glaucoma, stage unspecified: Secondary | ICD-10-CM | POA: Diagnosis not present

## 2012-12-06 DIAGNOSIS — H02059 Trichiasis without entropian unspecified eye, unspecified eyelid: Secondary | ICD-10-CM | POA: Diagnosis not present

## 2012-12-06 DIAGNOSIS — H409 Unspecified glaucoma: Secondary | ICD-10-CM | POA: Diagnosis not present

## 2012-12-13 DIAGNOSIS — Z85828 Personal history of other malignant neoplasm of skin: Secondary | ICD-10-CM | POA: Diagnosis not present

## 2012-12-13 DIAGNOSIS — T148 Other injury of unspecified body region: Secondary | ICD-10-CM | POA: Diagnosis not present

## 2012-12-22 DIAGNOSIS — H01029 Squamous blepharitis unspecified eye, unspecified eyelid: Secondary | ICD-10-CM | POA: Diagnosis not present

## 2012-12-29 DIAGNOSIS — H409 Unspecified glaucoma: Secondary | ICD-10-CM | POA: Diagnosis not present

## 2012-12-29 DIAGNOSIS — H4011X0 Primary open-angle glaucoma, stage unspecified: Secondary | ICD-10-CM | POA: Diagnosis not present

## 2013-01-09 DIAGNOSIS — H02059 Trichiasis without entropian unspecified eye, unspecified eyelid: Secondary | ICD-10-CM | POA: Diagnosis not present

## 2013-01-20 DIAGNOSIS — H4011X0 Primary open-angle glaucoma, stage unspecified: Secondary | ICD-10-CM | POA: Diagnosis not present

## 2013-01-20 DIAGNOSIS — H409 Unspecified glaucoma: Secondary | ICD-10-CM | POA: Diagnosis not present

## 2013-01-30 DIAGNOSIS — H02059 Trichiasis without entropian unspecified eye, unspecified eyelid: Secondary | ICD-10-CM | POA: Diagnosis not present

## 2013-02-17 DIAGNOSIS — H409 Unspecified glaucoma: Secondary | ICD-10-CM | POA: Diagnosis not present

## 2013-02-17 DIAGNOSIS — H4011X0 Primary open-angle glaucoma, stage unspecified: Secondary | ICD-10-CM | POA: Diagnosis not present

## 2013-02-23 DIAGNOSIS — L570 Actinic keratosis: Secondary | ICD-10-CM | POA: Diagnosis not present

## 2013-02-23 DIAGNOSIS — Z85828 Personal history of other malignant neoplasm of skin: Secondary | ICD-10-CM | POA: Diagnosis not present

## 2013-02-23 DIAGNOSIS — T148 Other injury of unspecified body region: Secondary | ICD-10-CM | POA: Diagnosis not present

## 2013-02-23 DIAGNOSIS — L821 Other seborrheic keratosis: Secondary | ICD-10-CM | POA: Diagnosis not present

## 2013-02-23 DIAGNOSIS — D692 Other nonthrombocytopenic purpura: Secondary | ICD-10-CM | POA: Diagnosis not present

## 2013-02-24 DIAGNOSIS — Z79899 Other long term (current) drug therapy: Secondary | ICD-10-CM | POA: Diagnosis not present

## 2013-02-24 DIAGNOSIS — Z1331 Encounter for screening for depression: Secondary | ICD-10-CM | POA: Diagnosis not present

## 2013-02-24 DIAGNOSIS — E46 Unspecified protein-calorie malnutrition: Secondary | ICD-10-CM | POA: Diagnosis not present

## 2013-02-24 DIAGNOSIS — I10 Essential (primary) hypertension: Secondary | ICD-10-CM | POA: Diagnosis not present

## 2013-02-24 DIAGNOSIS — Z0000 Encounter for general adult medical examination without abnormal findings: Secondary | ICD-10-CM | POA: Diagnosis not present

## 2013-03-04 ENCOUNTER — Encounter (HOSPITAL_COMMUNITY): Payer: Self-pay | Admitting: *Deleted

## 2013-03-04 ENCOUNTER — Emergency Department (HOSPITAL_COMMUNITY): Payer: Medicare Other

## 2013-03-04 ENCOUNTER — Inpatient Hospital Stay (HOSPITAL_COMMUNITY)
Admission: EM | Admit: 2013-03-04 | Discharge: 2013-03-06 | DRG: 644 | Disposition: A | Discharge status: Home / Self Care | Payer: Medicare Other | Attending: Internal Medicine | Admitting: Internal Medicine

## 2013-03-04 DIAGNOSIS — H409 Unspecified glaucoma: Secondary | ICD-10-CM | POA: Diagnosis not present

## 2013-03-04 DIAGNOSIS — R5381 Other malaise: Secondary | ICD-10-CM | POA: Diagnosis not present

## 2013-03-04 DIAGNOSIS — H538 Other visual disturbances: Secondary | ICD-10-CM | POA: Diagnosis present

## 2013-03-04 DIAGNOSIS — E236 Other disorders of pituitary gland: Secondary | ICD-10-CM | POA: Diagnosis not present

## 2013-03-04 DIAGNOSIS — I10 Essential (primary) hypertension: Secondary | ICD-10-CM

## 2013-03-04 DIAGNOSIS — E871 Hypo-osmolality and hyponatremia: Secondary | ICD-10-CM | POA: Diagnosis present

## 2013-03-04 DIAGNOSIS — H9190 Unspecified hearing loss, unspecified ear: Secondary | ICD-10-CM | POA: Diagnosis not present

## 2013-03-04 DIAGNOSIS — R51 Headache: Secondary | ICD-10-CM | POA: Diagnosis not present

## 2013-03-04 DIAGNOSIS — R42 Dizziness and giddiness: Secondary | ICD-10-CM | POA: Diagnosis not present

## 2013-03-04 DIAGNOSIS — R404 Transient alteration of awareness: Secondary | ICD-10-CM | POA: Diagnosis not present

## 2013-03-04 HISTORY — DX: Essential (primary) hypertension: I10

## 2013-03-04 HISTORY — DX: Unspecified glaucoma: H40.9

## 2013-03-04 LAB — COMPREHENSIVE METABOLIC PANEL
ALT: 17 U/L (ref 0–35)
AST: 45 U/L — ABNORMAL HIGH (ref 0–37)
Alkaline Phosphatase: 50 U/L (ref 39–117)
Chloride: 91 mEq/L — ABNORMAL LOW (ref 96–112)
GFR calc Af Amer: 88 mL/min — ABNORMAL LOW (ref >90)
Potassium: 4.5 mEq/L (ref 3.5–5.1)
Total Protein: 5.9 g/dL — ABNORMAL LOW (ref 6.0–8.3)

## 2013-03-04 LAB — URINALYSIS, ROUTINE W REFLEX MICROSCOPIC
Glucose, UA: NEGATIVE mg/dL
Hgb urine dipstick: NEGATIVE
pH: 7.5 (ref 5.0–8.0)

## 2013-03-04 LAB — URINE MICROSCOPIC-ADD ON

## 2013-03-04 LAB — CBC WITH DIFFERENTIAL/PLATELET
Basophils Absolute: 0 10*3/uL (ref 0.0–0.1)
Basophils Relative: 0 % (ref 0–1)
Eosinophils Absolute: 0.2 10*3/uL (ref 0.0–0.7)
HCT: 36.2 % (ref 36.0–46.0)
Hemoglobin: 12.8 g/dL (ref 12.0–15.0)
MCHC: 35.4 g/dL (ref 30.0–36.0)
MCV: 92.1 fL (ref 78.0–100.0)
Monocytes Absolute: 1 10*3/uL (ref 0.1–1.0)
Monocytes Relative: 13 % — ABNORMAL HIGH (ref 3–12)
RBC: 3.93 MIL/uL (ref 3.87–5.11)
RDW: 12.8 % (ref 11.5–15.5)

## 2013-03-04 NOTE — ED Notes (Signed)
Bed: WA21 Expected date:  Expected time:  Means of arrival:  Comments: EMS  

## 2013-03-04 NOTE — ED Notes (Signed)
Pt to ER via EMS for complaints of generalized weakness and dizziness since last pm; pt states that she had a head injury 3 yrs ago and has not been able to do much since; pt states that she spends most of her time in bed but that her generalized weakness became worse last pm; pt also c/o dizziness

## 2013-03-04 NOTE — ED Provider Notes (Addendum)
CSN: [id number]     Arrival date & time 03/04/13  2054 History   First MD Initiated Contact with Patient 03/04/13 2104     Chief Complaint  Patient presents with  . Weakness    HPI Pt to ER via EMS for complaints of generalized weakness and dizziness since last pm; pt states that she had a head injury 3 yrs ago and has not been able to do much since; pt states that she spends most of her time in bed but that her generalized weakness became worse last pm; pt also c/o dizziness.   Past Medical History  Diagnosis Date  . Hypertension   . Glaucoma    Past Surgical History  Procedure Laterality Date  . Appendectomy    . Abdominal surgery    . Cataract extraction     No family history on file. History  Substance Use Topics  . Smoking status: Never Smoker   . Smokeless tobacco: Not on file  . Alcohol Use: No   OB History   Grav Para Term Preterm Abortions TAB SAB Ect Mult Living                 Review of Systems  All other systems reviewed and are negative.    Allergies  Review of patient's allergies indicates no known allergies.  Home Medications   Current Outpatient Rx  Name  Route  Sig  Dispense  Refill  . bimatoprost (LUMIGAN) 0.01 % SOLN   Both Eyes   Place 1 drop into both eyes at bedtime.          Marland Kitchen BIOFLAVONOIDS PO   Oral   Take 1 capsule by mouth 3 (three) times daily after meals.         . calcium-vitamin D (OSCAL WITH D) 500-200 MG-UNIT per tablet   Oral   Take 1 tablet by mouth every morning.         . Calcium-Vitamin D-Vitamin K 500-100-40 MG-UNT-MCG CHEW   Oral   Chew 1 tablet by mouth every morning.         . dorzolamide-timolol (COSOPT) 22.3-6.8 MG/ML ophthalmic solution   Both Eyes   Place 1 drop into both eyes 2 (two) times daily.         . fish oil-omega-3 fatty acids 1000 MG capsule   Oral   Take 1 g by mouth 2 (two) times daily.         . valsartan (DIOVAN) 80 MG tablet   Oral   Take 40 mg by mouth every morning.           BP 175/66  Pulse 63  Temp(Src) 98.7 F (37.1 C) (Oral)  Resp 18  SpO2 97% Physical Exam  Nursing note and vitals reviewed. Constitutional: She appears well-developed. No distress.  HENT:  Head: Normocephalic and atraumatic.  Eyes: Pupils are equal, round, and reactive to light.  Neck: Normal range of motion.  Cardiovascular: Normal rate and intact distal pulses.   Pulmonary/Chest: No respiratory distress. She has no wheezes.  Abdominal: Normal appearance. She exhibits no distension.  Musculoskeletal: Normal range of motion.  Neurological: She is alert. No cranial nerve deficit. She displays no seizure activity.  Skin: Skin is warm and dry. No rash noted.  Psychiatric: She has a normal mood and affect. Her behavior is normal.    ED Course  Procedures (including critical care time) Labs Review Labs Reviewed  URINALYSIS, ROUTINE W REFLEX MICROSCOPIC - Abnormal; Notable for the  following:    Leukocytes, UA TRACE (*)    All other components within normal limits  CBC WITH DIFFERENTIAL - Abnormal; Notable for the following:    Monocytes Relative 13 (*)    All other components within normal limits  COMPREHENSIVE METABOLIC PANEL - Abnormal; Notable for the following:    Sodium 123 (*)    Chloride 91 (*)    Total Protein 5.9 (*)    Albumin 3.0 (*)    AST 45 (*)    GFR calc non Af Amer 76 (*)    GFR calc Af Amer 88 (*)    All other components within normal limits  URINE MICROSCOPIC-ADD ON   Imaging Review Ct Head Wo Contrast  03/04/2013   CLINICAL DATA:  Headache, weakness. Remote history of head trauma.  EXAM: CT HEAD WITHOUT CONTRAST  TECHNIQUE: Contiguous axial images were obtained from the base of the skull through the vertex without intravenous contrast.  COMPARISON:  09/15/2010  FINDINGS: Mild age related volume loss. No acute intracranial abnormality. Specifically, no hemorrhage, hydrocephalus, mass lesion, acute infarction, or significant intracranial injury. No acute  calvarial abnormality. Visualized paranasal sinuses and mastoids clear. Orbital soft tissues unremarkable.  IMPRESSION: No acute intracranial abnormality.   Electronically Signed   By: Charlett Nose   On: 03/04/2013 22:06    MDM   1. Hyponatremia        Nelia Shi, MD 03/04/13 [id number]  Nelia Shi, MD 03/26/13 2213

## 2013-03-05 ENCOUNTER — Encounter (HOSPITAL_COMMUNITY): Payer: Self-pay | Admitting: *Deleted

## 2013-03-05 DIAGNOSIS — I10 Essential (primary) hypertension: Secondary | ICD-10-CM | POA: Diagnosis present

## 2013-03-05 DIAGNOSIS — R42 Dizziness and giddiness: Secondary | ICD-10-CM | POA: Diagnosis present

## 2013-03-05 DIAGNOSIS — H409 Unspecified glaucoma: Secondary | ICD-10-CM | POA: Diagnosis present

## 2013-03-05 DIAGNOSIS — E871 Hypo-osmolality and hyponatremia: Secondary | ICD-10-CM | POA: Diagnosis present

## 2013-03-05 LAB — CBC WITH DIFFERENTIAL/PLATELET
Eosinophils Relative: 2 % (ref 0–5)
HCT: 36.7 % (ref 36.0–46.0)
Hemoglobin: 13 g/dL (ref 12.0–15.0)
Lymphocytes Relative: 35 % (ref 12–46)
Lymphs Abs: 2.6 10*3/uL (ref 0.7–4.0)
MCV: 92.4 fL (ref 78.0–100.0)
Monocytes Absolute: 1.1 10*3/uL — ABNORMAL HIGH (ref 0.1–1.0)
Monocytes Relative: 14 % — ABNORMAL HIGH (ref 3–12)
Neutro Abs: 3.7 10*3/uL (ref 1.7–7.7)
RBC: 3.97 MIL/uL (ref 3.87–5.11)
RDW: 12.8 % (ref 11.5–15.5)
WBC: 7.6 10*3/uL (ref 4.0–10.5)

## 2013-03-05 LAB — BASIC METABOLIC PANEL
BUN: 10 mg/dL (ref 6–23)
BUN: 10 mg/dL (ref 6–23)
Calcium: 9.1 mg/dL (ref 8.4–10.5)
Calcium: 9.1 mg/dL (ref 8.4–10.5)
Chloride: 94 mEq/L — ABNORMAL LOW (ref 96–112)
Creatinine, Ser: 0.64 mg/dL (ref 0.50–1.10)
Creatinine, Ser: 0.73 mg/dL (ref 0.50–1.10)
GFR calc Af Amer: 89 mL/min — ABNORMAL LOW (ref >90)
GFR calc Af Amer: 85 mL/min — ABNORMAL LOW (ref >90)
GFR calc non Af Amer: 77 mL/min — ABNORMAL LOW (ref >90)
Glucose, Bld: 96 mg/dL (ref 70–99)
Potassium: 3.8 mEq/L (ref 3.5–5.1)

## 2013-03-05 LAB — COMPREHENSIVE METABOLIC PANEL
AST: 36 U/L (ref 0–37)
BUN: 10 mg/dL (ref 6–23)
CO2: 24 mEq/L (ref 19–32)
Calcium: 9 mg/dL (ref 8.4–10.5)
Chloride: 93 mEq/L — ABNORMAL LOW (ref 96–112)
Creatinine, Ser: 0.65 mg/dL (ref 0.50–1.10)
GFR calc Af Amer: 89 mL/min — ABNORMAL LOW (ref >90)
GFR calc non Af Amer: 76 mL/min — ABNORMAL LOW (ref >90)
Glucose, Bld: 93 mg/dL (ref 70–99)
Total Bilirubin: 0.4 mg/dL (ref 0.3–1.2)

## 2013-03-05 LAB — PROTIME-INR: INR: 0.91 (ref 0.00–1.49)

## 2013-03-05 LAB — GLUCOSE, CAPILLARY: Glucose-Capillary: 98 mg/dL (ref 70–99)

## 2013-03-05 MED ORDER — SODIUM CHLORIDE 0.9 % IV SOLN
INTRAVENOUS | Status: DC
  Administered 2013-03-05 (×2): via INTRAVENOUS

## 2013-03-05 MED ORDER — DORZOLAMIDE HCL-TIMOLOL MAL 2-0.5 % OP SOLN
1.0000 [drp] | Freq: Two times a day (BID) | OPHTHALMIC | Status: DC
  Administered 2013-03-05 (×2): 1 [drp] via OPHTHALMIC
  Filled 2013-03-05: qty 10

## 2013-03-05 MED ORDER — SODIUM CHLORIDE 0.9 % IV SOLN: INTRAVENOUS | Status: DC

## 2013-03-05 MED ORDER — ACETAMINOPHEN 650 MG RE SUPP: 650.0000 mg | Freq: Four times a day (QID) | RECTAL | Status: DC | PRN

## 2013-03-05 MED ORDER — HEPARIN SODIUM (PORCINE) 5000 UNIT/ML IJ SOLN
5000.0000 [IU] | Freq: Three times a day (TID) | INTRAMUSCULAR | Status: DC
  Administered 2013-03-05 – 2013-03-06 (×4): 5000 [IU] via SUBCUTANEOUS
  Filled 2013-03-05 (×7): qty 1

## 2013-03-05 MED ORDER — SODIUM CHLORIDE 0.9 % IJ SOLN
3.0000 mL | Freq: Two times a day (BID) | INTRAMUSCULAR | Status: DC
  Administered 2013-03-05 (×2): 3 mL via INTRAVENOUS

## 2013-03-05 MED ORDER — MECLIZINE HCL 12.5 MG PO TABS
12.5000 mg | ORAL_TABLET | Freq: Three times a day (TID) | ORAL | Status: DC | PRN
  Filled 2013-03-05: qty 1

## 2013-03-05 MED ORDER — LATANOPROST 0.005 % OP SOLN
1.0000 [drp] | Freq: Every day | OPHTHALMIC | Status: DC
  Administered 2013-03-05: 1 [drp] via OPHTHALMIC
  Filled 2013-03-05: qty 2.5

## 2013-03-05 MED ORDER — MECLIZINE HCL 25 MG PO TABS
25.0000 mg | ORAL_TABLET | Freq: Two times a day (BID) | ORAL | Status: DC
  Administered 2013-03-05 – 2013-03-06 (×2): 25 mg via ORAL
  Filled 2013-03-05 (×3): qty 1

## 2013-03-05 MED ORDER — IRBESARTAN 75 MG PO TABS
75.0000 mg | ORAL_TABLET | Freq: Every day | ORAL | Status: DC
  Administered 2013-03-05 – 2013-03-06 (×2): 75 mg via ORAL
  Filled 2013-03-05 (×2): qty 1

## 2013-03-05 MED ORDER — ACETAMINOPHEN 325 MG PO TABS: 650.0000 mg | ORAL_TABLET | Freq: Four times a day (QID) | ORAL | Status: DC | PRN

## 2013-03-05 NOTE — ED Notes (Signed)
Hospitalist  At bedside. Report already called.

## 2013-03-05 NOTE — H&P (Signed)
[facility] History and Physical  Patient: Natasha David  ZOX:[id number]  DOB: 10-21-1920  DOA: 03/04/2013  Referring physician: Dr. Radford Pax PCP: Merlene Laughter from [place].   Chief Complaint: Dizziness  HPI: Natasha David is a 79 y.o. female with Past medical history of head trauma 3 years ago hypertension and glaucoma difficulty hearing. She presented today with the complaint of dizziness that has been ongoing since last night. She has chronic dizziness with orthostatic component in mentions that she has still sit at the edge of the bed for a while before she stand up to void dizziness. She also complains of chronic dizziness which occurs on an off when she moves her head from left to right. Occasionally it feels like vertigo. She denies any recent upper respiratory tract infection, headache, focal neurological deficit, nausea, vomiting, severe pain, ear discharge,  Or trauma. The husband mentions that 2 days ago when she was at the restroom, she felt dizzy and was going to pass out. There is no confusion or staring episodes. She denies any urinary symptoms. She has blurred vision and denies any recent worsening.  Review of Systems: as mentioned in the history of present illness.  A Comprehensive review of the other systems is negative.  Past Medical History  Diagnosis Date  . Hypertension   . Glaucoma    Past Surgical History  Procedure Laterality Date  . Appendectomy    . Abdominal surgery    . Cataract extraction     Social History:  reports that she has never smoked. She does not have any smokeless tobacco history on file. She reports that she does not drink alcohol or use illicit drugs. Patient is coming from home. Independent for most of her  ADL.  No Known Allergies  No family history on file.  Prior to Admission medications   Medication Sig Start Date End Date Taking? Authorizing Provider  bimatoprost (LUMIGAN) 0.01 % SOLN Place 1 drop into both eyes  at bedtime.    Yes Historical Provider, MD  BIOFLAVONOIDS PO Take 1 capsule by mouth 3 (three) times daily after meals.   Yes Historical Provider, MD  calcium-vitamin D (OSCAL WITH D) 500-200 MG-UNIT per tablet Take 1 tablet by mouth every morning.   Yes Historical Provider, MD  Calcium-Vitamin D-Vitamin K 500-100-40 MG-UNT-MCG CHEW Chew 1 tablet by mouth every morning.   Yes Historical Provider, MD  dorzolamide-timolol (COSOPT) 22.3-6.8 MG/ML ophthalmic solution Place 1 drop into both eyes 2 (two) times daily.   Yes Historical Provider, MD  fish oil-omega-3 fatty acids 1000 MG capsule Take 1 g by mouth 2 (two) times daily.   Yes Historical Provider, MD  valsartan (DIOVAN) 80 MG tablet Take 40 mg by mouth every morning.   Yes Historical Provider, MD    Physical Exam: Filed Vitals:   03/04/13 2055 03/04/13 2111 03/05/13 0235 03/05/13 0533  BP:  175/66 184/48 140/70  Pulse:  63 56 53  Temp:  98.7 F (37.1 C) 97.5 F (36.4 C) 97.5 F (36.4 C)  TempSrc:  Oral Oral   Resp:  18 18 14   SpO2: 95% 97% 100% 100%    General: Alert, Awake and Oriented to Time, Place and Person. Appear in mild distress Eyes: PERRL ENT: Oral Mucosa clear dry. Neck: no JVD, no Carotid Bruits  Cardiovascular: S1 and S2 Present, systolic aortic Murmur, Peripheral Pulses Present Respiratory: Bilateral Air entry equal and Decreased, Clear to Auscultation,  No Crackles,no wheezes Abdomen: Bowel Sound Present, Soft  and Non tender Skin: no Rash Extremities: no Pedal edema, no calf tenderness Neurologic: Grossly Unremarkable. Dizziness on movement of the head, horizontal nystagmus present bilaterally.  Labs on Admission:  CBC:  Recent Labs Lab 03/04/13 2134  WBC 7.5  NEUTROABS 3.2  HGB 12.8  HCT 36.2  MCV 92.1  PLT 282    CMP     Component Value Date/Time   NA 124* 03/05/2013 0241   K 3.8 03/05/2013 0241   CL 91* 03/05/2013 0241   CO2 27 03/05/2013 0241   GLUCOSE 96 03/05/2013 0241   BUN 10 03/05/2013  0241   CREATININE 0.64 03/05/2013 0241   CALCIUM 9.1 03/05/2013 0241   PROT 5.9* 03/04/2013 2134   ALBUMIN 3.0* 03/04/2013 2134   AST 45* 03/04/2013 2134   ALT 17 03/04/2013 2134   ALKPHOS 50 03/04/2013 2134   BILITOT 0.4 03/04/2013 2134   GFRNONAA 77* 03/05/2013 0241   GFRAA 89* 03/05/2013 0241    No results found for this basename: LIPASE, AMYLASE,  in the last 168 hours No results found for this basename: AMMONIA,  in the last 168 hours  Cardiac Enzymes: No results found for this basename: CKTOTAL, CKMB, CKMBINDEX, TROPONINI,  in the last 168 hours  BNP (last 3 results) No results found for this basename: PROBNP,  in the last 8760 hours  Radiological Exams on Admission: Ct Head Wo Contrast  03/04/2013   CLINICAL DATA:  Headache, weakness. Remote history of head trauma.  EXAM: CT HEAD WITHOUT CONTRAST  TECHNIQUE: Contiguous axial images were obtained from the base of the skull through the vertex without intravenous contrast.  COMPARISON:  09/15/2010  FINDINGS: Mild age related volume loss. No acute intracranial abnormality. Specifically, no hemorrhage, hydrocephalus, mass lesion, acute infarction, or significant intracranial injury. No acute calvarial abnormality. Visualized paranasal sinuses and mastoids clear. Orbital soft tissues unremarkable.  IMPRESSION: No acute intracranial abnormality.   Electronically Signed   By: Charlett Nose   On: 03/04/2013 22:06    EKG: Independently reviewed. there are no previous tracings available for comparison, nonspecific ST and T waves changes.  Assessment/Plan Principal Problem:   Hyponatremia Active Problems:   Dizziness and giddiness   Accelerated hypertension   Glaucoma   1. Hyponatremia Patient presented dizziness, she has mild hyponatremia. As per the family recently when they saw her PCP, they were told that she had low sodium levels, but no further workup was done. She denies any excessive water intake, she denies any nausea or vomiting,  she denies any weight loss. Currently she appears neurologically intact other than the dizziness that is elicited on head movement and horizontal nystagmus. With this it appears that she has chronic hyponatremia. Although she appears mildly dehydrated, therefore I would gently hydrate her, and repeat her sodium levels. If the sodium level does not improve she may require further workup.  2. Dizziness She appears to have BPPV, although with her accelerated hypertension possibility of neurogenic dizziness cannot be ruled out. A CT scan is negative for any acute stroke. We'll monitor her on neuro checks. I will add meclizine for dizziness.Marland Kitchen Physical therapy will be consulted for vestibular rehabilitation. She does not appear to have central dizziness, or nystagmus.  3. Accelerated hypertension Blood pressure has improved At present I would continue her on home antihypertensives  4. Glaucoma The patient has used her own eyedrops. Currently we do not have lumigen, I will change it to latanoprost, and if the family is not bringing the eyedrops we  will give her latanoprost.  DVT Prophylaxis: subcutaneous Heparin Nutrition: As tolerated  Code Status: Full  Family Communication: Family was at bedside and all questions were answered.   Author: Lynden Oxford, MD [facility] Pager: [phone] 03/05/2013, 6:06 AM    If 7PM-7AM, please contact night-coverage [email or web address] Password [email or web address]

## 2013-03-05 NOTE — Progress Notes (Addendum)
Patient seen and examined. Admitted after midnight secondary to dizziness and hyponatremia. Please referred to admission note from Dr. Eliane Decree for further details and info on admission.  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  Natasha David [phone]

## 2013-03-06 DIAGNOSIS — H409 Unspecified glaucoma: Secondary | ICD-10-CM

## 2013-03-06 DIAGNOSIS — I10 Essential (primary) hypertension: Secondary | ICD-10-CM

## 2013-03-06 LAB — BASIC METABOLIC PANEL
BUN: 9 mg/dL (ref 6–23)
CO2: 25 mEq/L (ref 19–32)
Calcium: 9.2 mg/dL (ref 8.4–10.5)
Chloride: 98 mEq/L (ref 96–112)
Creatinine, Ser: 0.64 mg/dL (ref 0.50–1.10)

## 2013-03-06 LAB — GLUCOSE, CAPILLARY: Glucose-Capillary: 93 mg/dL (ref 70–99)

## 2013-03-06 MED ORDER — MECLIZINE HCL 12.5 MG PO TABS: 12.5000 mg | ORAL_TABLET | Freq: Three times a day (TID) | ORAL | Status: DC | PRN

## 2013-03-06 NOTE — Care Management Note (Signed)
    Page 1 of 1   03/06/2013     3:51:06 PM   CARE MANAGEMENT NOTE 03/06/2013  Patient:  Natasha David, Natasha David   Account Number:  [id number]  Date Initiated:  03/05/2013  Documentation initiated by:  [facility]  Subjective/Objective Assessment:   79 year old female admitted with dizziness and hyponatremia.     Action/Plan:   From home.   Anticipated DC Date:  03/06/2013   Anticipated DC Plan:  HOME/SELF CARE      DC Planning Services  CM consult      Choice offered to / List presented to:             Status of service:  Completed, signed off Medicare Important Message given?  NA - LOS <3 / Initial given by admissions (If response is "NO", the following Medicare IM given date fields will be blank) Date Medicare IM given:   Date Additional Medicare IM given:    Discharge Disposition:  HOME/SELF CARE  Per UR Regulation:  Reviewed for med. necessity/level of care/duration of stay  If discussed at Long Length of Stay Meetings, dates discussed:    Comments:  03/06/13 Gates Jividen RN,BSN NCM [phone] D/C HOME NO NEEDS OR ORDERS.

## 2013-03-06 NOTE — Discharge Summary (Signed)
Physician Discharge Summary  Natasha David WUJ:[id number] DOB: 1921/04/07 DOA: 03/04/2013  PCP: No primary provider on file.  Admit date: 03/04/2013 Discharge date: 03/06/2013  Time spent: >30 minutes  Recommendations for Outpatient Follow-up:  BMET to follow electrolytes  Discharge Diagnoses:  Principal Problem:   Hyponatremia Active Problems:   Dizziness and giddiness   Accelerated hypertension   Glaucoma   Discharge Condition: stable and improved. Will discharge home with family care. Follow up with PCP in 10 days.   Filed Weights   03/05/13 0533 03/05/13 0700  Weight: 44 kg (97 lb) 44 kg (97 lb)    History of present illness:  79 y.o. female with Past medical history of head trauma 3 years ago hypertension and glaucoma difficulty hearing.  She presented today with the complaint of dizziness that has been ongoing since last night. She has chronic dizziness with orthostatic component in mentions that she has still sit at the edge of the bed for a while before she stand up to void dizziness. She also complains of chronic dizziness which occurs on an off when she moves her head from left to right. Occasionally it feels like vertigo. She denies any recent upper respiratory tract infection, headache, focal neurological deficit, nausea, vomiting, severe pain, ear discharge, Or trauma.  The husband mentions that 2 days ago when she was at the restroom, she felt dizzy and was going to pass out. There is no confusion or staring episodes. She denies any urinary symptoms. She has blurred vision and denies any recent worsening.   Hospital Course:  1. Hyponatremia: acute on chronic. Most likely secondary to mild dehydration on top of underlying SIADH. At discharge sodium level was 129; will follow with PCP in 10 days for further evaluation and treatment.   2. Dizziness  She appears to have BPPV, although her hyponatremia and mild dehydration can possibly be associated as well with  dizziness. A CT scan is negative for any acute stroke.  - started on meclizine for dizziness..  -Physical therapy has recommended supervision 24 hours and for her to take time when changing positions. -advise to keep herself hydrated  3. Hypertension: stable. Continue current medication regimen.  4. Glaucoma : stable continue current eye-drops   Procedures:  See below for x-ray reports  Consultations:  PT/OT  Discharge Exam: Filed Vitals:   03/06/13 0819  BP: 148/60  Pulse:   Temp:   Resp:     General: frail elderly woman, NAD, afebrile and denying dizziness  Cardiovascular: S1 and S2, no rubs or gallops Respiratory: CTA bilaterally Abdomen: soft, NT, ND, positive BS Extremities: no edema or cyanosis   Discharge Instructions  Discharge Orders   Future Orders Complete By Expires   Discharge instructions  As directed    Comments:     Arrange follow up with PCP in 10 days Take medications as prescribed Take your time when changing positions to avoid dizziness.   Increase activity slowly  As directed        Medication List         bimatoprost 0.01 % Soln  Commonly known as:  LUMIGAN  Place 1 drop into both eyes at bedtime.     BIOFLAVONOIDS PO  Take 1 capsule by mouth 3 (three) times daily after meals.     calcium-vitamin D 500-200 MG-UNIT per tablet  Commonly known as:  OSCAL WITH D  Take 1 tablet by mouth every morning.     Calcium-Vitamin D-Vitamin K 500-100-40 MG-UNT-MCG Chew  Chew 1 tablet by mouth every morning.     dorzolamide-timolol 22.3-6.8 MG/ML ophthalmic solution  Commonly known as:  COSOPT  Place 1 drop into both eyes 2 (two) times daily.     fish oil-omega-3 fatty acids 1000 MG capsule  Take 1 g by mouth 2 (two) times daily.     meclizine 12.5 MG tablet  Commonly known as:  ANTIVERT  Take 1 tablet (12.5 mg total) by mouth 3 (three) times daily as needed for dizziness or nausea (please take it schedule for the first 5 days; then use  PRN).     valsartan 80 MG tablet  Commonly known as:  DIOVAN  Take 40 mg by mouth every morning.       No Known Allergies     Follow-up Information   Follow up with Ginette Otto, MD. Schedule an appointment as soon as possible for a visit in 10 days.   Specialty:  Internal Medicine   Contact information:   [address] [place] [address] [phone]       The results of significant diagnostics from this hospitalization (including imaging, microbiology, ancillary and laboratory) are listed below for reference.    Significant Diagnostic Studies: Ct Head Wo Contrast  03/04/2013   CLINICAL DATA:  Headache, weakness. Remote history of head trauma.  EXAM: CT HEAD WITHOUT CONTRAST  TECHNIQUE: Contiguous axial images were obtained from the base of the skull through the vertex without intravenous contrast.  COMPARISON:  09/15/2010  FINDINGS: Mild age related volume loss. No acute intracranial abnormality. Specifically, no hemorrhage, hydrocephalus, mass lesion, acute infarction, or significant intracranial injury. No acute calvarial abnormality. Visualized paranasal sinuses and mastoids clear. Orbital soft tissues unremarkable.  IMPRESSION: No acute intracranial abnormality.   Electronically Signed   By: Charlett Nose   On: 03/04/2013 22:06   Labs: Basic Metabolic Panel:  Recent Labs Lab 03/04/13 2134 03/05/13 0241 03/05/13 0657 03/05/13 1945 03/06/13 0820  NA 123* 124* 124* 128* 129*  K 4.5 3.8 4.6 3.9 3.8  CL 91* 91* 93* 94* 98  CO2 27 27 24 25 25   GLUCOSE 98 96 93 134* 115*  BUN 11 10 10 10 9   CREATININE 0.67 0.64 0.65 0.73 0.64  CALCIUM 9.0 9.1 9.0 9.1 9.2   Liver Function Tests:  Recent Labs Lab 03/04/13 2134 03/05/13 0657  AST 45* 36  ALT 17 13  ALKPHOS 50 48  BILITOT 0.4 0.4  PROT 5.9* 5.7*  ALBUMIN 3.0* 2.8*   CBC:  Recent Labs Lab 03/04/13 2134 03/05/13 0657  WBC 7.5 7.6  NEUTROABS 3.2 3.7  HGB 12.8 13.0  HCT 36.2 36.7   MCV 92.1 92.4  PLT 282 302   CBG:  Recent Labs Lab 03/05/13 0732 03/06/13 0726  GLUCAP 98 93    Signed:  Lorry Anastasi  [facility] 03/06/2013, 11:10 AM

## 2013-03-06 NOTE — Evaluation (Signed)
Physical Therapy Evaluation Patient Details Name: Natasha David MRN: [id number] DOB: 10-Jul-1920 Today's Date: 03/06/2013 Time: 1478-2956 PT Time Calculation (min): 29 min  PT Assessment / Plan / Recommendation History of Present Illness  79 y.o. female wtih h/o head trauma 2* fall 3 years ago, orthostatic hypotension, blurred vision and glaucoma admitted with dizziness. Dx of hyponatremia.  Clinical Impression  *Pt ambulated 24' holding IV pole with supervision, no LOB. Pt denied dizziness throughout PT/OT eval. From a mobility standpoint pt appears to be at or near baseline. At home she walked with a quad cane. Will follow during acute stay to maximize safety and independence with mobility and to further monitor dizziness. **    PT Assessment  Patient needs continued PT services    Follow Up Recommendations  No PT follow up    Does the patient have the potential to tolerate intense rehabilitation      Barriers to Discharge        Equipment Recommendations  None recommended by PT    Recommendations for Other Services OT consult   Frequency Min 3X/week    Precautions / Restrictions Precautions Precautions: Fall Restrictions Weight Bearing Restrictions: No   Pertinent Vitals/Pain *BP 150/50 supine 149/69 sitting 148/60 standing No dizziness**      Mobility  Bed Mobility Bed Mobility: Supine to Sit Supine to Sit: 4: Min assist;HOB flat Details for Bed Mobility Assistance: min A to advance BLEs (per husband this is baseline) Transfers Transfers: Sit to Stand;Stand to Sit Sit to Stand: 4: Min guard;From bed;From toilet Stand to Sit: 5: Supervision;To bed;To toilet Details for Transfer Assistance: min/guard for safety 2* h/o dizziness. Pt denied dizziness throughout eval.  Ambulation/Gait Ambulation/Gait Assistance: 4: Min guard Ambulation Distance (Feet): 24 Feet (12' x 2 to/from bathroom) Assistive device: Other (Comment) (holding IV pole with RUE) Ambulation/Gait  Assistance Details: no LOB Gait Pattern: Within Functional Limits Gait velocity: WFL General Gait Details: steady with IV pole, pt normally uses quad cane    Exercises     PT Diagnosis: Generalized weakness  PT Problem List: Decreased mobility;Decreased activity tolerance PT Treatment Interventions: Gait training;Functional mobility training;Therapeutic exercise     PT Goals(Current goals can be found in the care plan section) Acute Rehab PT Goals Patient Stated Goal: go home today PT Goal Formulation: With patient/family Time For Goal Achievement: 03/20/13 Potential to Achieve Goals: Good  Visit Information  Last PT Received On: 03/06/13 Assistance Needed: +1 PT/OT Co-Evaluation/Treatment: Yes History of Present Illness: 79 y.o. female wtih h/o head trauma 2* fall 3 years ago, orthostatic hypotension, blurred vision and glaucoma admitted with dizziness. Dx of hyponatremia.       Prior Functioning  Home Living Family/patient expects to be discharged to:: Private residence Living Arrangements: Spouse/significant other Available Help at Discharge: Family;Available 24 hours/day Type of Home: House Home Access: Stairs to enter [employer] of Steps: 5 Entrance Stairs-Rails: Right;Left Home Layout: Laundry or work area in basement;Two level;Able to live on main level with bedroom/bathroom Alternate Level Stairs-Number of Steps: flight to basement -husband does laundry, pt rarely goes to basement Home Equipment: Cane - quad Additional Comments: handicapped height commode, sponge bathes Prior Function Level of Independence: Independent with assistive device(s) Comments: uses quad cane, no falls since fall 3 yrs ago  Communication Communication: No difficulties Dominant Hand: Right    Cognition  Cognition Arousal/Alertness: Awake/alert Behavior During Therapy: WFL for tasks assessed/performed Overall Cognitive Status: Within Functional Limits for tasks assessed     Extremity/Trunk  Assessment Upper Extremity Assessment Upper Extremity Assessment: Overall WFL for tasks assessed Lower Extremity Assessment Lower Extremity Assessment: Overall WFL for tasks assessed Cervical / Trunk Assessment Cervical / Trunk Assessment: Kyphotic   Balance Balance Balance Assessed: Yes Static Sitting Balance Static Sitting - Balance Support: No upper extremity supported;Feet supported Static Sitting - Level of Assistance: 7: Independent Static Sitting - Comment/# of Minutes: 10 minutes, no LOB  End of Session PT - End of Session Activity Tolerance: Patient tolerated treatment well Patient left: in bed;with call bell/phone within reach;with family/visitor present;with nursing/sitter in room Nurse Communication: Mobility status  GP     Natasha David 03/06/2013, 8:48 AM [phone]

## 2013-03-06 NOTE — Evaluation (Signed)
Occupational Therapy Evaluation Patient Details Name: Natasha David MRN: [id number] DOB: 06/10/1921 Today's Date: 03/06/2013 Time: 4098-1191 OT Time Calculation (min): 31 min  OT Assessment / Plan / Recommendation History of present illness 79 y.o. female wtih h/o head trauma 2* fall 3 years ago, orthostatic hypotension, blurred vision and glaucoma admitted with dizziness. Dx of hyponatremia.   Clinical Impression   Pt was admitted with dizziness.  At time of eval, dizziness was gone. Pt has a difficult time describing it--sounds like dysequilibrium.  Pt feels like she is near baseline; will follow in acute to increase safety and independence and monitor for dizziness.      OT Assessment  Patient needs continued OT Services    Follow Up Recommendations  Supervision/Assistance - 24 hour    Barriers to Discharge      Equipment Recommendations  None recommended by OT    Recommendations for Other Services    Frequency  Min 2X/week    Precautions / Restrictions Precautions Precautions: Fall Restrictions Weight Bearing Restrictions: No   Pertinent Vitals/Pain No c/o pain    ADL  Grooming: Set up;Supervision/safety Where Assessed - Grooming: Supported standing Lower Body Dressing: Supervision/safety;Set up Where Assessed - Lower Body Dressing: Supported sit to stand Toilet Transfer: Hydrographic surveyor Method: Sit to Barista: Comfort height toilet Equipment Used:  (used IV pole; pt usually uses quad cane) Transfers/Ambulation Related to ADLs: ambulated to bathroom with min guard A ADL Comments: Pt has no c/o dizziness today. She states it comes and goes and describes dysequilibrium.  Placed in provoking head positions for BPPV, but pt does not c/o these symptoms.  Attempted VOR, pt states she feels that will make her dizzy if she continues and she guarded head after a few repeitions   Do not feel pt would tolerate adaptation exercises.  . Pt has a  h/o glaucoma.  Tracking and saccades are WFLs.   She had a bad experience with OP PT for balance.  Husband helps as needed.  Pt is overall set up for UB adls and min guard for balance with LB adls.     OT Diagnosis: Generalized weakness  OT Problem List: Impaired balance (sitting and/or standing);Decreased strength;Decreased activity tolerance OT Treatment Interventions: Self-care/ADL training;DME and/or AE instruction;Patient/family education;Balance training   OT Goals(Current goals can be found in the care plan section) Acute Rehab OT Goals Patient Stated Goal: go home today OT Goal Formulation: With patient Time For Goal Achievement: 03/13/13 Potential to Achieve Goals: Good ADL Goals Pt Will Transfer to Toilet: with supervision;ambulating (comfort height commode, all aspects) Additional ADL Goal #1: pt will gather clothes and complete LB adl at supervision level  Visit Information  Last OT Received On: 03/06/13 Assistance Needed: +1 PT/OT Co-Evaluation/Treatment: Yes History of Present Illness: 79 y.o. female wtih h/o head trauma 2* fall 3 years ago, orthostatic hypotension, blurred vision and glaucoma admitted with dizziness. Dx of hyponatremia.       Prior Functioning     Home Living Family/patient expects to be discharged to:: Private residence Living Arrangements: Spouse/significant other Available Help at Discharge: Family;Available 24 hours/day Type of Home: House Home Access: Stairs to enter [employer] of Steps: 5 Entrance Stairs-Rails: Right;Left Home Layout: Laundry or work area in basement;Two level;Able to live on main level with bedroom/bathroom Alternate Level Stairs-Number of Steps: flight to basement -husband does laundry, pt rarely goes to basement Home Equipment: Cane - quad Additional Comments: handicapped height commode, sponge bathes Prior  Function Level of Independence: Independent with assistive device(s) Comments: uses quad cane, no  falls since fall 3 yrs ago  Communication Communication: No difficulties Dominant Hand: Right         Vision/Perception Vision - Assessment Eye Alignment: Within Functional Limits Additional Comments: no nystagmus at rest   Cognition  Cognition Arousal/Alertness: Awake/alert Behavior During Therapy: WFL for tasks assessed/performed Overall Cognitive Status: Within Functional Limits for tasks assessed    Extremity/Trunk Assessment Upper Extremity Assessment Upper Extremity Assessment: Overall WFL for tasks assessed Lower Extremity Assessment Lower Extremity Assessment: Overall WFL for tasks assessed Cervical / Trunk Assessment Cervical / Trunk Assessment: Kyphotic     Mobility Bed Mobility Bed Mobility: Supine to Sit Supine to Sit: 4: Min assist;HOB flat Details for Bed Mobility Assistance: min A to advance BLEs (per husband this is baseline) Transfers Transfers: Sit to Stand;Stand to Sit Sit to Stand: 4: Min guard;From bed;From toilet Stand to Sit: 5: Supervision;To bed;To toilet Details for Transfer Assistance: min/guard for safety 2* h/o dizziness. Pt denied dizziness throughout eval.      Exercise     Balance Balance Balance Assessed: Yes Static Sitting Balance Static Sitting - Balance Support: No upper extremity supported;Feet supported Static Sitting - Level of Assistance: 7: Independent Static Sitting - Comment/# of Minutes: 10 minutes, no LOB   End of Session OT - End of Session Activity Tolerance: Patient tolerated treatment well Patient left: in bed;with call bell/phone within reach;with family/visitor present  GO     Rosalyn Archambault 03/06/2013, 8:47 AM Marica Otter, OTR/L [phone] 03/06/2013

## 2013-03-20 DIAGNOSIS — Z23 Encounter for immunization: Secondary | ICD-10-CM | POA: Diagnosis not present

## 2013-03-21 DIAGNOSIS — I359 Nonrheumatic aortic valve disorder, unspecified: Secondary | ICD-10-CM | POA: Diagnosis not present

## 2013-03-21 DIAGNOSIS — Z79899 Other long term (current) drug therapy: Secondary | ICD-10-CM | POA: Diagnosis not present

## 2013-03-21 DIAGNOSIS — E871 Hypo-osmolality and hyponatremia: Secondary | ICD-10-CM | POA: Diagnosis not present

## 2013-03-21 DIAGNOSIS — R413 Other amnesia: Secondary | ICD-10-CM | POA: Diagnosis not present

## 2013-03-23 DIAGNOSIS — Z79899 Other long term (current) drug therapy: Secondary | ICD-10-CM | POA: Diagnosis not present

## 2013-03-30 DIAGNOSIS — Z79899 Other long term (current) drug therapy: Secondary | ICD-10-CM | POA: Diagnosis not present

## 2013-04-25 DIAGNOSIS — R413 Other amnesia: Secondary | ICD-10-CM | POA: Diagnosis not present

## 2013-04-25 DIAGNOSIS — I10 Essential (primary) hypertension: Secondary | ICD-10-CM | POA: Diagnosis not present

## 2013-06-20 DIAGNOSIS — H02059 Trichiasis without entropian unspecified eye, unspecified eyelid: Secondary | ICD-10-CM | POA: Diagnosis not present

## 2013-06-20 DIAGNOSIS — H409 Unspecified glaucoma: Secondary | ICD-10-CM | POA: Diagnosis not present

## 2013-06-20 DIAGNOSIS — H4011X0 Primary open-angle glaucoma, stage unspecified: Secondary | ICD-10-CM | POA: Diagnosis not present

## 2013-06-26 DIAGNOSIS — R05 Cough: Secondary | ICD-10-CM | POA: Diagnosis not present

## 2013-06-26 DIAGNOSIS — R059 Cough, unspecified: Secondary | ICD-10-CM | POA: Diagnosis not present

## 2013-06-26 DIAGNOSIS — I10 Essential (primary) hypertension: Secondary | ICD-10-CM | POA: Diagnosis not present

## 2013-06-26 DIAGNOSIS — R413 Other amnesia: Secondary | ICD-10-CM | POA: Diagnosis not present

## 2013-06-26 DIAGNOSIS — Z79899 Other long term (current) drug therapy: Secondary | ICD-10-CM | POA: Diagnosis not present

## 2013-06-26 DIAGNOSIS — G479 Sleep disorder, unspecified: Secondary | ICD-10-CM | POA: Diagnosis not present

## 2013-06-26 DIAGNOSIS — R300 Dysuria: Secondary | ICD-10-CM | POA: Diagnosis not present

## 2013-07-14 DIAGNOSIS — H02059 Trichiasis without entropian unspecified eye, unspecified eyelid: Secondary | ICD-10-CM | POA: Diagnosis not present

## 2013-07-14 DIAGNOSIS — H01029 Squamous blepharitis unspecified eye, unspecified eyelid: Secondary | ICD-10-CM | POA: Diagnosis not present

## 2013-09-19 DIAGNOSIS — H43819 Vitreous degeneration, unspecified eye: Secondary | ICD-10-CM | POA: Diagnosis not present

## 2013-09-20 DIAGNOSIS — D485 Neoplasm of uncertain behavior of skin: Secondary | ICD-10-CM | POA: Diagnosis not present

## 2013-09-20 DIAGNOSIS — L570 Actinic keratosis: Secondary | ICD-10-CM | POA: Diagnosis not present

## 2013-09-20 DIAGNOSIS — Z85828 Personal history of other malignant neoplasm of skin: Secondary | ICD-10-CM | POA: Diagnosis not present

## 2013-10-18 ENCOUNTER — Emergency Department (HOSPITAL_COMMUNITY): Payer: Medicare Other

## 2013-10-18 ENCOUNTER — Observation Stay (HOSPITAL_COMMUNITY)
Admission: EM | Admit: 2013-10-18 | Discharge: 2013-10-19 | Disposition: A | Discharge status: Home / Self Care | Payer: Medicare Other | Attending: Family Medicine | Admitting: Family Medicine

## 2013-10-18 ENCOUNTER — Encounter (HOSPITAL_COMMUNITY): Payer: Self-pay | Admitting: Emergency Medicine

## 2013-10-18 DIAGNOSIS — G459 Transient cerebral ischemic attack, unspecified: Secondary | ICD-10-CM | POA: Diagnosis not present

## 2013-10-18 DIAGNOSIS — Z9089 Acquired absence of other organs: Secondary | ICD-10-CM | POA: Diagnosis not present

## 2013-10-18 DIAGNOSIS — R5383 Other fatigue: Secondary | ICD-10-CM | POA: Diagnosis not present

## 2013-10-18 DIAGNOSIS — Z7982 Long term (current) use of aspirin: Secondary | ICD-10-CM | POA: Diagnosis not present

## 2013-10-18 DIAGNOSIS — E871 Hypo-osmolality and hyponatremia: Secondary | ICD-10-CM

## 2013-10-18 DIAGNOSIS — R930 Abnormal findings on diagnostic imaging of skull and head, not elsewhere classified: Secondary | ICD-10-CM | POA: Insufficient documentation

## 2013-10-18 DIAGNOSIS — I10 Essential (primary) hypertension: Secondary | ICD-10-CM | POA: Insufficient documentation

## 2013-10-18 DIAGNOSIS — R5381 Other malaise: Secondary | ICD-10-CM | POA: Diagnosis not present

## 2013-10-18 DIAGNOSIS — R42 Dizziness and giddiness: Secondary | ICD-10-CM | POA: Diagnosis not present

## 2013-10-18 DIAGNOSIS — I359 Nonrheumatic aortic valve disorder, unspecified: Secondary | ICD-10-CM

## 2013-10-18 DIAGNOSIS — H409 Unspecified glaucoma: Secondary | ICD-10-CM | POA: Insufficient documentation

## 2013-10-18 DIAGNOSIS — R404 Transient alteration of awareness: Secondary | ICD-10-CM | POA: Diagnosis not present

## 2013-10-18 DIAGNOSIS — Z79899 Other long term (current) drug therapy: Secondary | ICD-10-CM | POA: Insufficient documentation

## 2013-10-18 LAB — RAPID URINE DRUG SCREEN, HOSP PERFORMED
AMPHETAMINES: NOT DETECTED
BARBITURATES: NOT DETECTED
BENZODIAZEPINES: NOT DETECTED
Cocaine: NOT DETECTED
Opiates: NOT DETECTED
Tetrahydrocannabinol: NOT DETECTED

## 2013-10-18 LAB — COMPREHENSIVE METABOLIC PANEL
ALK PHOS: 56 U/L (ref 39–117)
ALT: 7 U/L (ref 0–35)
AST: 25 U/L (ref 0–37)
Albumin: 3.2 g/dL — ABNORMAL LOW (ref 3.5–5.2)
BILIRUBIN TOTAL: 0.5 mg/dL (ref 0.3–1.2)
BUN: 11 mg/dL (ref 6–23)
CHLORIDE: 94 meq/L — AB (ref 96–112)
CO2: 27 meq/L (ref 19–32)
Calcium: 9.4 mg/dL (ref 8.4–10.5)
Creatinine, Ser: 0.72 mg/dL (ref 0.50–1.10)
GFR, EST AFRICAN AMERICAN: 85 mL/min — AB (ref >90)
GFR, EST NON AFRICAN AMERICAN: 73 mL/min — AB (ref >90)
GLUCOSE: 103 mg/dL — AB (ref 70–99)
POTASSIUM: 4.1 meq/L (ref 3.7–5.3)
SODIUM: 131 meq/L — AB (ref 137–147)
Total Protein: 6.4 g/dL (ref 6.0–8.3)

## 2013-10-18 LAB — URINALYSIS, ROUTINE W REFLEX MICROSCOPIC
BILIRUBIN URINE: NEGATIVE
Glucose, UA: NEGATIVE mg/dL
Ketones, ur: NEGATIVE mg/dL
Leukocytes, UA: NEGATIVE
Nitrite: NEGATIVE
PROTEIN: NEGATIVE mg/dL
Specific Gravity, Urine: 1.007 (ref 1.005–1.030)
UROBILINOGEN UA: 0.2 mg/dL (ref 0.0–1.0)
pH: 7 (ref 5.0–8.0)

## 2013-10-18 LAB — CBC
HCT: 37.7 % (ref 36.0–46.0)
HEMOGLOBIN: 13.4 g/dL (ref 12.0–15.0)
MCH: 32.5 pg (ref 26.0–34.0)
MCHC: 35.5 g/dL (ref 30.0–36.0)
MCV: 91.5 fL (ref 78.0–100.0)
Platelets: 314 10*3/uL (ref 150–400)
RBC: 4.12 MIL/uL (ref 3.87–5.11)
RDW: 12.8 % (ref 11.5–15.5)
WBC: 6.5 10*3/uL (ref 4.0–10.5)

## 2013-10-18 LAB — PROTIME-INR
INR: 0.87 (ref 0.00–1.49)
Prothrombin Time: 11.7 seconds (ref 11.6–15.2)

## 2013-10-18 LAB — HEMOGLOBIN A1C
Hgb A1c MFr Bld: 5.3 % (ref <5.7)
Mean Plasma Glucose: 105 mg/dL (ref <117)

## 2013-10-18 LAB — URINE MICROSCOPIC-ADD ON

## 2013-10-18 LAB — TROPONIN I: Troponin I: <0.3 ng/mL (ref <0.30)

## 2013-10-18 LAB — GLUCOSE, CAPILLARY: GLUCOSE-CAPILLARY: 115 mg/dL — AB (ref 70–99)

## 2013-10-18 LAB — APTT: aPTT: 24 seconds (ref 24–37)

## 2013-10-18 MED ORDER — HEPARIN SODIUM (PORCINE) 5000 UNIT/ML IJ SOLN
5000.0000 [IU] | Freq: Three times a day (TID) | INTRAMUSCULAR | Status: DC
  Administered 2013-10-18 – 2013-10-19 (×3): 5000 [IU] via SUBCUTANEOUS
  Filled 2013-10-18 (×6): qty 1

## 2013-10-18 MED ORDER — DORZOLAMIDE HCL-TIMOLOL MAL 2-0.5 % OP SOLN
1.0000 [drp] | Freq: Two times a day (BID) | OPHTHALMIC | Status: DC
  Administered 2013-10-18 – 2013-10-19 (×3): 1 [drp] via OPHTHALMIC
  Filled 2013-10-18: qty 10

## 2013-10-18 MED ORDER — SODIUM CHLORIDE 0.9 % IJ SOLN
3.0000 mL | Freq: Two times a day (BID) | INTRAMUSCULAR | Status: DC
Start: 2013-10-18 — End: 2013-10-19
  Administered 2013-10-18 – 2013-10-19 (×3): 3 mL via INTRAVENOUS

## 2013-10-18 MED ORDER — MECLIZINE HCL 12.5 MG PO TABS
12.5000 mg | ORAL_TABLET | Freq: Three times a day (TID) | ORAL | Status: DC | PRN
  Administered 2013-10-18: 12.5 mg via ORAL
  Filled 2013-10-18: qty 1

## 2013-10-18 MED ORDER — ASPIRIN 325 MG PO TABS
325.0000 mg | ORAL_TABLET | Freq: Every day | ORAL | Status: DC
  Administered 2013-10-18 – 2013-10-19 (×2): 325 mg via ORAL
  Filled 2013-10-18 (×2): qty 1

## 2013-10-18 MED ORDER — SODIUM CHLORIDE 0.9 % IJ SOLN: 3.0000 mL | INTRAMUSCULAR | Status: DC | PRN

## 2013-10-18 MED ORDER — SODIUM CHLORIDE 0.9 % IV BOLUS (SEPSIS)
500.0000 mL | Freq: Once | INTRAVENOUS | Status: AC
  Administered 2013-10-18: 500 mL via INTRAVENOUS

## 2013-10-18 MED ORDER — LATANOPROST 0.005 % OP SOLN
1.0000 [drp] | Freq: Every day | OPHTHALMIC | Status: DC
  Administered 2013-10-18: 1 [drp] via OPHTHALMIC
  Filled 2013-10-18: qty 2.5

## 2013-10-18 MED ORDER — ENSURE COMPLETE PO LIQD
237.0000 mL | ORAL | Status: DC
  Administered 2013-10-18: 237 mL via ORAL

## 2013-10-18 MED ORDER — SODIUM CHLORIDE 0.9 % IV SOLN: 250.0000 mL | INTRAVENOUS | Status: DC | PRN

## 2013-10-18 NOTE — ED Provider Notes (Signed)
CSN: [id number]     Arrival date & time 10/18/13  0703 History   First MD Initiated Contact with Patient 10/18/13 [phone]     Chief Complaint  Patient presents with  . Weakness     (Consider location/radiation/quality/duration/timing/severity/associated sxs/prior Treatment) HPI Pt presents with c/o weakness and dizziness.  Pt states she during the night last night she awoke and felt the room spinning.  She also states that she has been feeling progressively worse over the past several weeks.  No focal weakness or numbness. No changes in vision or speech.  No vomiting or fevers.  No syncope.  No chest pain or shortness of breath.  Symptoms are constant.  There are no other associated systemic symptoms, there are no other alleviating or modifying factors.   Past Medical History  Diagnosis Date  . Hypertension   . Glaucoma    Past Surgical History  Procedure Laterality Date  . Appendectomy    . Abdominal surgery      stomach wrap for reflux, surgery for "locked bowels"  . Cataract extraction     History reviewed. No pertinent family history. History  Substance Use Topics  . Smoking status: Never Smoker   . Smokeless tobacco: Never Used  . Alcohol Use: No   OB History   Grav Para Term Preterm Abortions TAB SAB Ect Mult Living                 Review of Systems ROS reviewed and all otherwise negative except for mentioned in HPI    Allergies  Amoxicillin  Home Medications   Prior to Admission medications   Medication Sig Start Date End Date Taking? Authorizing Provider  aspirin EC 81 MG tablet Take 81 mg by mouth every morning.   Yes Historical Provider, MD  bimatoprost (LUMIGAN) 0.01 % SOLN Place 1 drop into both eyes at bedtime.    Yes Historical Provider, MD  BIOFLAVONOIDS PO Take 1 capsule by mouth 3 (three) times daily after meals.   Yes Historical Provider, MD  calcium-vitamin D (OSCAL WITH D) 500-200 MG-UNIT per tablet Take 1 tablet by mouth every morning.   Yes  Historical Provider, MD  Calcium-Vitamin D-Vitamin K 500-100-40 MG-UNT-MCG CHEW Chew 1 tablet by mouth every morning.   Yes Historical Provider, MD  dorzolamide-timolol (COSOPT) 22.3-6.8 MG/ML ophthalmic solution Place 1 drop into both eyes 2 (two) times daily.   Yes Historical Provider, MD  fish oil-omega-3 fatty acids 1000 MG capsule Take 1 g by mouth 2 (two) times daily.   Yes Historical Provider, MD  meclizine (ANTIVERT) 12.5 MG tablet Take 1 tablet (12.5 mg total) by mouth 3 (three) times daily as needed for dizziness or nausea (please take it schedule for the first 5 days; then use PRN). 03/06/13  Yes Barton Dubois, MD  valsartan (DIOVAN) 160 MG tablet Take 160 mg by mouth daily.   Yes Historical Provider, MD   BP 154/56  Pulse 81  Temp(Src) 97.2 F (36.2 C) (Oral)  Resp 20  Ht 4\' 11"  (1.499 m)  Wt 96 lb 12.8 oz (43.908 kg)  BMI 19.54 kg/m2  SpO2 100% Vitals reviewed Physical Exam Physical Examination: General appearance - alert, well appearing, and in no distress Mental status - alert, oriented to person, place, and time Eyes - pupils equal and reactive, extraocular eye movements intact Mouth - mucous membranes moist, pharynx normal without lesions Chest - clear to auscultation, no wheezes, rales or rhonchi, symmetric air entry Heart - normal rate, regular  rhythm, normal S1, S2, no murmurs, rubs, clicks or gallops Abdomen - soft, nontender, nondistended, no masses or organomegaly Extremities - peripheral pulses normal, no pedal edema, no clubbing or cyanosis Neuro- alert and oriented x 3, cranial nerves 2-12 tested and intact, strength 5/5 in extremities x 4, sensation intact, no nystagmus Skin - normal coloration and turgor, no rashes  ED Course  Procedures (including critical care time)  10:46 AM d/w Dr. Wendee Beavers, triad for admission to osbservation, telemetry bed.    Date: 10/18/2013  Rate: 56  Rhythm: sinus bradycardia with first degree AV block  QRS Axis: normal   Intervals: PR prolonged  ST/T Wave abnormalities: normal  Conduction Disutrbances:first-degree A-V block   Narrative Interpretation:   Old EKG Reviewed: none available  Labs Review Labs Reviewed  COMPREHENSIVE METABOLIC PANEL - Abnormal; Notable for the following:    Sodium 131 (*)    Chloride 94 (*)    Glucose, Bld 103 (*)    Albumin 3.2 (*)    GFR calc non Af Amer 73 (*)    GFR calc Af Amer 85 (*)    All other components within normal limits  URINALYSIS, ROUTINE W REFLEX MICROSCOPIC - Abnormal; Notable for the following:    APPearance CLOUDY (*)    Hgb urine dipstick SMALL (*)    All other components within normal limits  URINE MICROSCOPIC-ADD ON - Abnormal; Notable for the following:    Bacteria, UA FEW (*)    All other components within normal limits  LIPID PANEL - Abnormal; Notable for the following:    Cholesterol 227 (*)    LDL Cholesterol 146 (*)    All other components within normal limits  GLUCOSE, CAPILLARY - Abnormal; Notable for the following:    Glucose-Capillary 115 (*)    All other components within normal limits  CBC  TROPONIN I  PROTIME-INR  APTT  HEMOGLOBIN A1C  URINE RAPID DRUG SCREEN (HOSP PERFORMED)    Imaging Review Ct Head Wo Contrast  10/18/2013   CLINICAL DATA:  Weakness and dizziness and decreased appetite  EXAM: CT HEAD WITHOUT CONTRAST  TECHNIQUE: Contiguous axial images were obtained from the base of the skull through the vertex without intravenous contrast.  COMPARISON:  CT SINUS LTD W/O CM dated 08/12/2004; CT HEAD W/O CM dated 03/04/2013  FINDINGS: There is mild diffuse cerebral and cerebellar atrophy with compensatory ventriculomegaly. There is no intracranial hemorrhage nor intracranial mass effect. There is no evidence of an evolving ischemic infarction. The cerebellum and brainstem are normal in density. There are no abnormal intracranial calcifications.  At bone window settings there is no lytic or blastic lesion or evidence of an acute skull  fracture. 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 The observed portions of the paranasal sinuses and mastoid air cells are clear.  IMPRESSION: There are mild age related changes present which appear stable. There is no evidence of an acute ischemic or hemorrhagic event. There is no intracranial mass effect or hydrocephalus.   Electronically Signed   By: David  Martinique   On: 10/18/2013 08:17   Mr Jodene Nam Head Wo Contrast  10/19/2013   CLINICAL DATA:  Dizziness and weakness with decreased appetite. Hypertension. Trauma 2012.  EXAM: MRI HEAD WITHOUT CONTRAST  MRA HEAD WITHOUT CONTRAST  TECHNIQUE: Multiplanar, multiecho pulse sequences of the brain and surrounding structures were obtained without intravenous contrast. Angiographic images of the head were obtained using MRA technique without contrast.  COMPARISON:  10/18/2013 CT.  02/12/2011  MR.  FINDINGS: MRI HEAD FINDINGS  No acute infarct.  Small area of blood breakdown products adjacent to the atrium of the right lateral ventricle unchanged and may be related to prior hemorrhage ischemia or trauma. No new intracranial hemorrhage detected.  Mild small vessel disease type changes.  Global atrophy without hydrocephalus.  No intracranial mass lesion noted on this unenhanced exam.  Major intracranial vascular structures are patent.  Cervical medullary junction, pituitary region, pineal region and orbital structures unremarkable.  MRA HEAD FINDINGS  No medium or large size vessel significant stenosis or occlusion.  Right posterior inferior cerebellar artery and left anterior inferior cerebellar artery not visualized. This may be related to reciprocal relationship with well visualized right anterior inferior cerebellar artery and left posterior inferior cerebellar artery.  Fetal type contribution to the right posterior cerebral artery.  Mild branch vessel irregularity.  No aneurysm detected.  IMPRESSION: MR brain:  No acute infarct.  Mild small vessel  disease type changes.  Global atrophy without hydrocephalus.  MR angiogram:  No medium or large size vessel significant stenosis or occlusion.  Please see above.   Electronically Signed   By: Chauncey Cruel M.D.   On: 10/19/2013 08:23   Mri Brain Without Contrast  10/19/2013   CLINICAL DATA:  Dizziness and weakness with decreased appetite. Hypertension. Trauma 2012.  EXAM: MRI HEAD WITHOUT CONTRAST  MRA HEAD WITHOUT CONTRAST  TECHNIQUE: Multiplanar, multiecho pulse sequences of the brain and surrounding structures were obtained without intravenous contrast. Angiographic images of the head were obtained using MRA technique without contrast.  COMPARISON:  10/18/2013 CT.  02/12/2011 MR.  FINDINGS: MRI HEAD FINDINGS  No acute infarct.  Small area of blood breakdown products adjacent to the atrium of the right lateral ventricle unchanged and may be related to prior hemorrhage ischemia or trauma. No new intracranial hemorrhage detected.  Mild small vessel disease type changes.  Global atrophy without hydrocephalus.  No intracranial mass lesion noted on this unenhanced exam.  Major intracranial vascular structures are patent.  Cervical medullary junction, pituitary region, pineal region and orbital structures unremarkable.  MRA HEAD FINDINGS  No medium or large size vessel significant stenosis or occlusion.  Right posterior inferior cerebellar artery and left anterior inferior cerebellar artery not visualized. This may be related to reciprocal relationship with well visualized right anterior inferior cerebellar artery and left posterior inferior cerebellar artery.  Fetal type contribution to the right posterior cerebral artery.  Mild branch vessel irregularity.  No aneurysm detected.  IMPRESSION: MR brain:  No acute infarct.  Mild small vessel disease type changes.  Global atrophy without hydrocephalus.  MR angiogram:  No medium or large size vessel significant stenosis or occlusion.  Please see above.   Electronically  Signed   By: Chauncey Cruel M.D.   On: 10/19/2013 08:23     EKG Interpretation None      Date: 10/19/2013  Rate: 56  Rhythm: sinus bradycardia  QRS Axis: normal  Intervals: PR prolonged  ST/T Wave abnormalities: nonspecific st/t wave changes  Conduction Disutrbances:nonspecific intraventricular conduction delay  Narrative Interpretation:   Old EKG Reviewed: none available   MDM   Final diagnoses:  Accelerated hypertension  Glaucoma  Vertigo  Hyponatremia  Dizziness and giddiness    Pt presenting with c/o dizziness/vertigo and generalized weakness.  Neuro exam is reassuring.  Labs and heat CT are reassuring as well. Pt admitted to triad for further workup and management.      Threasa Beards, MD 10/19/13  1929 

## 2013-10-18 NOTE — Progress Notes (Signed)
INITIAL NUTRITION ASSESSMENT  DOCUMENTATION CODES Per approved criteria  -Not Applicable   INTERVENTION: -Recommend Ensure Complete Q24H, each supplement provides 350 kcal and 13 grams of protein   NUTRITION DIAGNOSIS: Inadequate oral intake related to decreased appetite/early satiety as evidenced by PO intake <75%, unintentional wt loss.   Goal: Pt to meet >/= 90% of their estimated nutrition needs    Monitor:  Total protein/energy intake, labs, weights  80 y.o. female  Admitting Dx: <principal problem not specified>  ASSESSMENT: Natasha David is a 80 y.o. female  With history of vertigo, glaucoma, hypertension. Presenting to the ED after patient developed vertigo once standing from the bed earlier in the a.m. the problem started yesterday insidiously and resolved without any intervention  -Pt endorsed an unintentional 4 lb weight loss, was unable to recall when the weight loss occurred but noted that usual weight was around 100 lbs and she felt her clothes had been fitting looser -Diet recall shows pt consuming three small meals per day, including breakfast cereals, vegetables and usually chicken for protein. Pt understands importance of nutrition, and has been trying to increase intake; however continues to have feelings of early satiety -Husband concerned at pt's decreasing portion sizes. Had been on chocolate Ensure supplement previously, but recently stopped. Was in agreement to reintroduce regimen for weight maintenence  Height: Ht Readings from Last 1 Encounters:  10/18/13 4\' 11"  (1.499 m)    Weight: Wt Readings from Last 1 Encounters:  10/18/13 96 lb 12.8 oz (43.908 kg)    Ideal Body Weight: 97.5 lbs  % Ideal Body Weight: 98%  Wt Readings from Last 10 Encounters:  10/18/13 96 lb 12.8 oz (43.908 kg)  03/05/13 97 lb (44 kg)    Usual Body Weight: 100 lbs  % Usual Body Weight: 96%  BMI:  Body mass index is 19.54 kg/(m^2).  Estimated Nutritional  Needs: Kcal: 1200-1400 Protein: 45-55 gram Fluid: >/= 1400 ml/daily  Skin: WDL  Diet Order: Cardiac  EDUCATION NEEDS: -No education needs identified at this time   Intake/Output Summary (Last 24 hours) at 10/18/13 1559 Last data filed at 10/18/13 1500  Gross per 24 hour  Intake    120 ml  Output      0 ml  Net    120 ml    Last BM: pta   Labs:   Recent Labs Lab 10/18/13 0756  NA 131*  K 4.1  CL 94*  CO2 27  BUN 11  CREATININE 0.72  CALCIUM 9.4  GLUCOSE 103*    CBG (last 3)  No results found for this basename: GLUCAP,  in the last 72 hours  Scheduled Meds: . aspirin  325 mg Oral Daily  . dorzolamide-timolol  1 drop Both Eyes BID  . feeding supplement (ENSURE COMPLETE)  237 mL Oral Q24H  . heparin  5,000 Units Subcutaneous 3 times per day  . latanoprost  1 drop Both Eyes QHS  . sodium chloride  3 mL Intravenous Q12H    Continuous Infusions:   Past Medical History  Diagnosis Date  . Hypertension   . Glaucoma     Past Surgical History  Procedure Laterality Date  . Appendectomy    . Abdominal surgery      stomach wrap for reflux, surgery for "locked bowels"  . Cataract extraction      Atlee Abide MS RD LDN Clinical Dietitian [phone]

## 2013-10-18 NOTE — H&P (Signed)
[facility] History and Physical  Natasha David VVO:[id number] DOB: February 22, 1921 DOA: 10/18/2013  Referring physician: Dr. Canary Brim PCP: No primary provider on file.   Chief Complaint: Vertigo when standing from the bed  HPI: Natasha David is a 80 y.o. female  With history of vertigo, glaucoma, hypertension. Presenting to the ED after patient developed vertigo once standing from the bed earlier in the a.m. the problem started yesterday insidiously and resolved without any intervention. The problem occurred when she was trying to go to the bathroom. No falls reported. No trauma reported. Patient denies any palpitations or heart flutter prior to event. Eyes any fevers or chills. Denies any chest discomfort. She denies any hemoptysis or shortness of breath. She currently has no new complaints.  Given history ED physician recommended admission for further evaluation to rule out CNS cause related to vertigo experienced this a.m.   Review of Systems:  Constitutional:  No weight loss, night sweats, Fevers, chills, fatigue.  HEENT:  No headaches, Difficulty swallowing,Tooth/dental problems,Sore throat,  No sneezing, itching, ear ache, nasal congestion, post nasal drip,  Cardio-vascular:  No chest pain, Orthopnea, PND, swelling in lower extremities, anasarca, dizziness, palpitations  GI:  No heartburn, indigestion, abdominal pain, nausea, vomiting, diarrhea, change in bowel habits, loss of appetite  Resp:  No shortness of breath with exertion or at rest. No excess mucus, no productive cough, No non-productive cough, No coughing up of blood.No change in color of mucus.No wheezing.No chest wall deformity  Skin:  no rash or lesions.  GU:  no dysuria, change in color of urine, no urgency or frequency. No flank pain.  Musculoskeletal:  No joint pain or swelling. No decreased range of motion. No back pain.  Psych:  No change in mood or affect. No depression or anxiety. No memory loss.    Past Medical History  Diagnosis Date  . Hypertension   . Glaucoma    Past Surgical History  Procedure Laterality Date  . Appendectomy    . Abdominal surgery      stomach wrap for reflux, surgery for "locked bowels"  . Cataract extraction     Social History:  reports that she has never smoked. She has never used smokeless tobacco. She reports that she does not drink alcohol or use illicit drugs.  Allergies  Allergen Reactions  . Amoxicillin Nausea Only    History reviewed. No pertinent family history.   Prior to Admission medications   Medication Sig Start Date End Date Taking? Authorizing Provider  aspirin EC 81 MG tablet Take 81 mg by mouth every morning.   Yes Historical Provider, MD  bimatoprost (LUMIGAN) 0.01 % SOLN Place 1 drop into both eyes at bedtime.    Yes Historical Provider, MD  BIOFLAVONOIDS PO Take 1 capsule by mouth 3 (three) times daily after meals.   Yes Historical Provider, MD  calcium-vitamin D (OSCAL WITH D) 500-200 MG-UNIT per tablet Take 1 tablet by mouth every morning.   Yes Historical Provider, MD  Calcium-Vitamin D-Vitamin K 500-100-40 MG-UNT-MCG CHEW Chew 1 tablet by mouth every morning.   Yes Historical Provider, MD  dorzolamide-timolol (COSOPT) 22.3-6.8 MG/ML ophthalmic solution Place 1 drop into both eyes 2 (two) times daily.   Yes Historical Provider, MD  fish oil-omega-3 fatty acids 1000 MG capsule Take 1 g by mouth 2 (two) times daily.   Yes Historical Provider, MD  meclizine (ANTIVERT) 12.5 MG tablet Take 1 tablet (12.5 mg total) by mouth 3 (three) times daily as needed for  dizziness or nausea (please take it schedule for the first 5 days; then use PRN). 03/06/13  Yes Barton Dubois, MD  valsartan (DIOVAN) 160 MG tablet Take 160 mg by mouth daily.   Yes Historical Provider, MD   Physical Exam: Filed Vitals:   10/18/13 1257  BP: 167/52  Pulse: 65  Temp: 97.3 F (36.3 C)  Resp: 20    BP 167/52  Pulse 65  Temp(Src) 97.3 F (36.3 C) (Oral)   Resp 20  Ht 4\' 11"  (1.499 m)  Wt 43.908 kg (96 lb 12.8 oz)  BMI 19.54 kg/m2  SpO2 100%  General:  Appears calm and comfortable Eyes: PERRL, normal lids, irises & conjunctiva ENT: grossly normal lips & tongue, dry mucous membranes Neck: no LAD, masses or thyromegaly Cardiovascular: RRR, no m/r/g. No LE edema. Telemetry: SR, no arrhythmias  Respiratory: CTA bilaterally, no w/r/r. Normal respiratory effort. Abdomen: soft, nt, nd Skin: no rash or induration seen on limited exam Musculoskeletal: grossly normal tone BUE/BLE Psychiatric: grossly normal mood and affect, speech fluent and appropriate Neurologic: Pt hard of hearing, answers questions appropriately, moves all extremities equally, no facial asymmetry           Labs on Admission:  Basic Metabolic Panel:  Recent Labs Lab 10/18/13 0756  NA 131*  K 4.1  CL 94*  CO2 27  GLUCOSE 103*  BUN 11  CREATININE 0.72  CALCIUM 9.4   Liver Function Tests:  Recent Labs Lab 10/18/13 0756  AST 25  ALT 7  ALKPHOS 56  BILITOT 0.5  PROT 6.4  ALBUMIN 3.2*   No results found for this basename: LIPASE, AMYLASE,  in the last 168 hours No results found for this basename: AMMONIA,  in the last 168 hours CBC:  Recent Labs Lab 10/18/13 0756  WBC 6.5  HGB 13.4  HCT 37.7  MCV 91.5  PLT 314   Cardiac Enzymes:  Recent Labs Lab 10/18/13 0756  TROPONINI <0.30    BNP (last 3 results) No results found for this basename: PROBNP,  in the last 8760 hours CBG: No results found for this basename: GLUCAP,  in the last 168 hours  Radiological Exams on Admission: Ct Head Wo Contrast  10/18/2013   CLINICAL DATA:  Weakness and dizziness and decreased appetite  EXAM: CT HEAD WITHOUT CONTRAST  TECHNIQUE: Contiguous axial images were obtained from the base of the skull through the vertex without intravenous contrast.  COMPARISON:  CT SINUS LTD W/O CM dated 08/12/2004; CT HEAD W/O CM dated 03/04/2013  FINDINGS: There is mild diffuse cerebral  and cerebellar atrophy with compensatory ventriculomegaly. There is no intracranial hemorrhage nor intracranial mass effect. There is no evidence of an evolving ischemic infarction. The cerebellum and brainstem are normal in density. There are no abnormal intracranial calcifications.  At bone window settings there is no lytic or blastic lesion or evidence of an acute skull fracture. 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 The observed portions of the paranasal sinuses and mastoid air cells are clear.  IMPRESSION: There are mild age related changes present which appear stable. There is no evidence of an acute ischemic or hemorrhagic event. There is no intracranial mass effect or hydrocephalus.   Electronically Signed   By: David  Martinique   On: 10/18/2013 08:17    Assessment/Plan Active Problems:   Vertigo:  - At this point we'll rule out a CNS cause please see order sets for details regarding specific imaging tests and further workup -  If workup for CNS cause negative patient may have to be referred to ENT on this - Physical therapy to evaluate - Telemetry monitoring  Glaucoma  - Stable continue home regimen  Accelerated HTN - Hold blood pressure medications secondary to above until CNS cause ruled out  Code Status: full code Family Communication: Discussed with husband at bedside Disposition Plan: Pending results of workup  Time spent: > 55 minutes  [facility] Pager [phone]

## 2013-10-18 NOTE — ED Notes (Signed)
Patient transported to X-ray 

## 2013-10-18 NOTE — Progress Notes (Signed)
Echocardiogram 2D Echocardiogram has been performed.  Natasha David 10/18/2013, 4:15 PM

## 2013-10-18 NOTE — ED Notes (Signed)
Bed: WY63 Expected date:  Expected time:  Means of arrival:  Comments: EMS 80yo Weakness, decreased intake

## 2013-10-18 NOTE — Progress Notes (Signed)
*  PRELIMINARY RESULTS* Vascular Ultrasound Carotid Duplex (Doppler) has been completed.  Preliminary findings: Right  1-39% ICA stenosis.  Tortuous ICA. Left = 1-39% ICA stenosis, borderline >40%. Tortuous ICA. Vertebral artery flow is antegrade.      Chapman Fitch RVT 10/18/2013, 3:06 PM

## 2013-10-18 NOTE — ED Notes (Addendum)
Pt comes from home, reports weakness that has been getting worse the past couple of days. Pt states she tried to get up and she felt weak and dizzy. Decrease in food intake. Denies pain. Pt is alert and oriented.Pt husband states she fell 5 years ago and hit the back of her head, states pt has had dizziness since then.

## 2013-10-18 NOTE — Care Management Note (Addendum)
    Page 1 of 1   10/19/2013     5:00:34 PM   CARE MANAGEMENT NOTE 10/19/2013  Patient:  Natasha David, Natasha David   Account Number:  [id number]  Date Initiated:  10/18/2013  Documentation initiated by:  Dessa Phi  Subjective/Objective Assessment:   80 Y/O F ADMITTED W/VERTIGO.     Action/Plan:   FROM HOME.HAS PCP.   Anticipated DC Date:  10/19/2013   Anticipated DC Plan:  [place]  CM consult  Patient refused services      Choice offered to / List presented to:  C-3 Spouse   DME arranged  Vassie Moselle      DME agency  [facility]        Status of service:  Completed, signed off Medicare Important Message given?   (If response is "NO", the following Medicare IM given date fields will be blank) Date Medicare IM given:   Date Additional Medicare IM given:    Discharge Disposition:  HOME/SELF CARE  Per UR Regulation:  Reviewed for med. necessity/level of care/duration of stay  If discussed at [place] of Stay Meetings, dates discussed:    Comments:  10/19/13 Natasha David NR,BSN NCM [place].INFORMED THAT IF THEY GET HOME & NEED HHPT,THEY CAN CONTACT THEIR PCP.SPOUSE VOICED UNDERSTANDING.PAGED DR. VEGA-AWARE OF PATIENT/SPOUSE PLEASANTLY DECLININGHHPT.  10/18/13 Natasha Sallade RN,BSN NCM [phone]

## 2013-10-19 ENCOUNTER — Observation Stay (HOSPITAL_COMMUNITY): Payer: Medicare Other

## 2013-10-19 DIAGNOSIS — R5383 Other fatigue: Secondary | ICD-10-CM | POA: Diagnosis not present

## 2013-10-19 DIAGNOSIS — I10 Essential (primary) hypertension: Secondary | ICD-10-CM | POA: Diagnosis not present

## 2013-10-19 DIAGNOSIS — R42 Dizziness and giddiness: Secondary | ICD-10-CM | POA: Diagnosis not present

## 2013-10-19 DIAGNOSIS — R5381 Other malaise: Secondary | ICD-10-CM | POA: Diagnosis not present

## 2013-10-19 LAB — LIPID PANEL
Cholesterol: 227 mg/dL — ABNORMAL HIGH (ref 0–200)
HDL: 73 mg/dL (ref >39)
LDL Cholesterol: 146 mg/dL — ABNORMAL HIGH (ref 0–99)
Total CHOL/HDL Ratio: 3.1 RATIO
Triglycerides: 41 mg/dL (ref <150)
VLDL: 8 mg/dL (ref 0–40)

## 2013-10-19 NOTE — Progress Notes (Signed)
[facility] PROGRESS NOTE  Natasha David YBO:[id number] DOB: 1920-07-13 DOA: 10/18/2013 PCP: No primary provider on file.  Assessment/Plan:   Vertigo - Orthostatic blood pressures negative for orthostatic hypotension - PT evaluation - CT/MRI of head/brain: Negative for acute stroke - Carotid dopplers negative for clinically significant ICA stenosis - Echocardiogram pending.  Active Problems:   Accelerated hypertension -  Will continue to hold home regimen    Glaucoma - Stable continue home regimen   Code Status: full  Family Communication: discussed with spouse at bedside.  Disposition Plan: Pending test results and recommendations from PT   Consultants:  None  Procedures:  As listed above.  Antibiotics:  None  HPI/Subjective: No new complaints. No acute issues reported overnight.  Objective: Filed Vitals:   10/19/13 0615  BP: 152/52  Pulse: 70  Temp: 98.2 F (36.8 C)  Resp: 16    Intake/Output Summary (Last 24 hours) at 10/19/13 0919 Last data filed at 10/19/13 0616  Gross per 24 hour  Intake    440 ml  Output   1175 ml  Net   -735 ml   Filed Weights   10/18/13 1257  Weight: 43.908 kg (96 lb 12.8 oz)    Exam:   General:  Pt in NAD, alert and awake  Cardiovascular: RRR, no MRG  Respiratory: CTA BL, no wheezes  Abdomen: soft, NT, ND  Musculoskeletal: no cyanosis or clubbing   Data Reviewed: Basic Metabolic Panel:  Recent Labs Lab 10/18/13 0756  NA 131*  K 4.1  CL 94*  CO2 27  GLUCOSE 103*  BUN 11  CREATININE 0.72  CALCIUM 9.4   Liver Function Tests:  Recent Labs Lab 10/18/13 0756  AST 25  ALT 7  ALKPHOS 56  BILITOT 0.5  PROT 6.4  ALBUMIN 3.2*   No results found for this basename: LIPASE, AMYLASE,  in the last 168 hours No results found for this basename: AMMONIA,  in the last 168 hours CBC:  Recent Labs Lab 10/18/13 0756  WBC 6.5  HGB 13.4  HCT 37.7  MCV 91.5  PLT 314   Cardiac Enzymes:  Recent  Labs Lab 10/18/13 0756  TROPONINI <0.30   BNP (last 3 results) No results found for this basename: PROBNP,  in the last 8760 hours CBG:  Recent Labs Lab 10/18/13 2305  GLUCAP 115*    No results found for this or any previous visit (from the past 240 hour(s)).   Studies: Ct Head Wo Contrast  10/18/2013   CLINICAL DATA:  Weakness and dizziness and decreased appetite  EXAM: CT HEAD WITHOUT CONTRAST  TECHNIQUE: Contiguous axial images were obtained from the base of the skull through the vertex without intravenous contrast.  COMPARISON:  CT SINUS LTD W/O CM dated 08/12/2004; CT HEAD W/O CM dated 03/04/2013  FINDINGS: There is mild diffuse cerebral and cerebellar atrophy with compensatory ventriculomegaly. There is no intracranial hemorrhage nor intracranial mass effect. There is no evidence of an evolving ischemic infarction. The cerebellum and brainstem are normal in density. There are no abnormal intracranial calcifications.  At bone window settings there is no lytic or blastic lesion or evidence of an acute skull fracture. 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 The observed portions of the paranasal sinuses and mastoid air cells are clear.  IMPRESSION: There are mild age related changes present which appear stable. There is no evidence of an acute ischemic or hemorrhagic event. There is no intracranial mass effect or hydrocephalus.  Electronically Signed   By: David  Martinique   On: 10/18/2013 08:17   Mr Jodene Nam Head Wo Contrast  10/19/2013   CLINICAL DATA:  Dizziness and weakness with decreased appetite. Hypertension. Trauma 2012.  EXAM: MRI HEAD WITHOUT CONTRAST  MRA HEAD WITHOUT CONTRAST  TECHNIQUE: Multiplanar, multiecho pulse sequences of the brain and surrounding structures were obtained without intravenous contrast. Angiographic images of the head were obtained using MRA technique without contrast.  COMPARISON:  10/18/2013 CT.  02/12/2011 MR.  FINDINGS: MRI HEAD FINDINGS  No  acute infarct.  Small area of blood breakdown products adjacent to the atrium of the right lateral ventricle unchanged and may be related to prior hemorrhage ischemia or trauma. No new intracranial hemorrhage detected.  Mild small vessel disease type changes.  Global atrophy without hydrocephalus.  No intracranial mass lesion noted on this unenhanced exam.  Major intracranial vascular structures are patent.  Cervical medullary junction, pituitary region, pineal region and orbital structures unremarkable.  MRA HEAD FINDINGS  No medium or large size vessel significant stenosis or occlusion.  Right posterior inferior cerebellar artery and left anterior inferior cerebellar artery not visualized. This may be related to reciprocal relationship with well visualized right anterior inferior cerebellar artery and left posterior inferior cerebellar artery.  Fetal type contribution to the right posterior cerebral artery.  Mild branch vessel irregularity.  No aneurysm detected.  IMPRESSION: MR brain:  No acute infarct.  Mild small vessel disease type changes.  Global atrophy without hydrocephalus.  MR angiogram:  No medium or large size vessel significant stenosis or occlusion.  Please see above.   Electronically Signed   By: Chauncey Cruel M.D.   On: 10/19/2013 08:23   Mri Brain Without Contrast  10/19/2013   CLINICAL DATA:  Dizziness and weakness with decreased appetite. Hypertension. Trauma 2012.  EXAM: MRI HEAD WITHOUT CONTRAST  MRA HEAD WITHOUT CONTRAST  TECHNIQUE: Multiplanar, multiecho pulse sequences of the brain and surrounding structures were obtained without intravenous contrast. Angiographic images of the head were obtained using MRA technique without contrast.  COMPARISON:  10/18/2013 CT.  02/12/2011 MR.  FINDINGS: MRI HEAD FINDINGS  No acute infarct.  Small area of blood breakdown products adjacent to the atrium of the right lateral ventricle unchanged and may be related to prior hemorrhage ischemia or trauma. No  new intracranial hemorrhage detected.  Mild small vessel disease type changes.  Global atrophy without hydrocephalus.  No intracranial mass lesion noted on this unenhanced exam.  Major intracranial vascular structures are patent.  Cervical medullary junction, pituitary region, pineal region and orbital structures unremarkable.  MRA HEAD FINDINGS  No medium or large size vessel significant stenosis or occlusion.  Right posterior inferior cerebellar artery and left anterior inferior cerebellar artery not visualized. This may be related to reciprocal relationship with well visualized right anterior inferior cerebellar artery and left posterior inferior cerebellar artery.  Fetal type contribution to the right posterior cerebral artery.  Mild branch vessel irregularity.  No aneurysm detected.  IMPRESSION: MR brain:  No acute infarct.  Mild small vessel disease type changes.  Global atrophy without hydrocephalus.  MR angiogram:  No medium or large size vessel significant stenosis or occlusion.  Please see above.   Electronically Signed   By: Chauncey Cruel M.D.   On: 10/19/2013 08:23    Scheduled Meds: . aspirin  325 mg Oral Daily  . dorzolamide-timolol  1 drop Both Eyes BID  . feeding supplement (ENSURE COMPLETE)  237 mL Oral Q24H  .  heparin  5,000 Units Subcutaneous 3 times per day  . latanoprost  1 drop Both Eyes QHS  . sodium chloride  3 mL Intravenous Q12H   Continuous Infusions:   Active Problems:   Accelerated hypertension   Glaucoma   Vertigo    Time spent: >35 minutes    [facility] Pager [phone] If 7PM-7AM, please contact night-coverage at [email or web address], password [facility] 10/19/2013, 9:19 AM  LOS: 1 day

## 2013-10-19 NOTE — Progress Notes (Signed)
Discharge paperwork reviewed with pt and husband. They deny further questions or concerns at this time.  IV d/c'd, pt tolerated well.  Pt discharged to home in stable condition.  Garry Heater, RN 10/19/2013

## 2013-10-19 NOTE — Evaluation (Signed)
Physical Therapy Evaluation Patient Details Name: Natasha David MRN: [id number] DOB: 25-Mar-1921 Today's Date: 10/19/2013   History of Present Illness  Pt is a 80 year old female admitted with vertigo and PMHx of head trauma 5 years ago, glaucoma, and hypertension.  Clinical Impression  Patient evaluated by Physical Therapy with no further acute PT needs identified. All education has been completed and the patient has no further questions.  Pt did not recall reason for admission and upon husband explaining vertigo, pt unwilling to perform vestibular assessment due to fear of dizziness returning however agreeable to mobility.  Pt ambulated in hallway using RW min/guard for safety.  Encouraged spouse to use RW at home especially if pt reports dizziness however spouse states pt will be reluctant to use as she prefers her quad cane.  Spouse agreeable to HHPT. See below for any follow-up Physial Therapy or equipment needs.  Plan is for pt to d/c home today per RN so pt can f/u with HHPT, and acute PT is signing off. Thank you for this referral.     Follow Up Recommendations Home health PT;Supervision for mobility/OOB  [facility] therapist familiar with vertigo would best benefit pt.    Equipment Recommendations  Rolling walker with 5" wheels (may need youth size)    Recommendations for Other Services       Precautions / Restrictions Precautions Precautions: Fall      Mobility  Bed Mobility Overal bed mobility: Needs Assistance Bed Mobility: Supine to Sit;Sit to Supine     Supine to sit: Supervision Sit to supine: Supervision   General bed mobility comments: only required safety cues for hospital bed, ie rails  Transfers Overall transfer level: Needs assistance Equipment used: Rolling walker (2 wheeled) Transfers: Sit to/from Stand Sit to Stand: Min guard         General transfer comment: min/guard due to admission for vertigo however pt reports no  dizziness  Ambulation/Gait Ambulation/Gait assistance: Min guard Ambulation Distance (Feet): 150 Feet Assistive device: Rolling walker (2 wheeled) Gait Pattern/deviations: Step-through pattern;Trunk flexed Gait velocity: decr   General Gait Details: no unsteadiness observed, verbal cues for use of RW, had pt read room numbers and signs to perform head turns and pt had no difficulty  Stairs            Wheelchair Mobility    Modified Rankin (Stroke Patients Only)       Balance Overall balance assessment: History of Falls                                           Pertinent Vitals/Pain Denies dizziness throughout session.  No pain reported    Home Living Family/patient expects to be discharged to:: Private residence Living Arrangements: Spouse/significant other Available Help at Discharge: Family;Available 24 hours/day Type of Home: House Home Access: Stairs to enter Entrance Stairs-Rails: Right (pt uses rail and quad cane) Entrance Stairs-Number of Steps: 4-5 Home Layout: Able to live on main level with bedroom/bathroom Home Equipment: Cane - quad      Prior Function Level of Independence: Independent with assistive device(s)         Comments: uses quad cane     Hand Dominance        Extremity/Trunk Assessment   Upper Extremity Assessment: Overall WFL for tasks assessed           Lower Extremity Assessment:  Overall WFL for tasks assessed [facility] for age)      Cervical / Trunk Assessment: Kyphotic  Communication   Communication: No difficulties  Cognition Arousal/Alertness: Awake/alert   Overall Cognitive Status: Impaired/Different from baseline Area of Impairment: Memory;Safety/judgement     Memory: Decreased short-term memory   Safety/Judgement: Decreased awareness of safety     General Comments: pt's spouse reports "her stories" (about dizziness, hospitalization) aren't accurate and says MD told him she likely has  early onset dementia or Alzheimers.   Pt also not very cooperative which husband reports is not her typical baseline    General Comments      Exercises        Assessment/Plan    PT Assessment All further PT needs can be met in the next venue of care  PT Diagnosis Other (comment);Difficulty walking (vertigo)   PT Problem List Decreased mobility;Decreased balance  PT Treatment Interventions     PT Goals (Current goals can be found in the Care Plan section) Acute Rehab PT Goals PT Goal Formulation: No goals set, d/c therapy    Frequency     Barriers to discharge        Co-evaluation               End of Session   Activity Tolerance: Patient tolerated treatment well Patient left: in bed;with call bell/phone within reach;with bed alarm set;with family/visitor present Nurse Communication: Mobility status    Functional Assessment Tool Used: clinical judgement Functional Limitation: Mobility: Walking and moving around Mobility: Walking and Moving Around Current Status ([id number]): At least 1 percent but less than 20 percent impaired, limited or restricted Mobility: Walking and Moving Around Goal Status [phone]): At least 1 percent but less than 20 percent impaired, limited or restricted Mobility: Walking and Moving Around Discharge Status [phone]): At least 1 percent but less than 20 percent impaired, limited or restricted    Time: 1435-1456 PT Time Calculation (min): 21 min   Charges:   PT Evaluation $Initial PT Evaluation Tier I: 1 Procedure PT Treatments $Gait Training: 8-22 mins   PT G Codes:   Functional Assessment Tool Used: clinical judgement Functional Limitation: Mobility: Walking and moving around    [employer] 10/19/2013, 3:17 PM Carmelia Bake, PT, DPT 10/19/2013 Pager: [phone]

## 2013-10-19 NOTE — Discharge Summary (Signed)
Physician Discharge Summary  Natasha David:[id number] DOB: [birth year] DOA: 10/18/2013  PCP: No primary provider on file.  Admit date: 10/18/2013 Discharge date: 10/19/2013  Time spent: > 35  minutes  Recommendations for Outpatient Follow-up:  1. F/u with your pcp or ENT physician after discharge  Discharge Diagnoses:  Active Problems:   Accelerated hypertension   Glaucoma   Vertigo   Discharge Condition: stable  Diet recommendation: heart healthy  Filed Weights   10/18/13 1257  Weight: 43.908 kg (96 lb 12.8 oz)    History of present illness:  Pt is a 80 y/o with history of vertigo that presented to the hospital after two episodes of vertigo related to positional changes.  Hospital Course:  Vertigo - Not related to new central nervous system cause (negative CT/MRI of head/brain  For acute stroke) - Pt to f/u with pcp or ENT for further evaluation and recommendations - no fevers - orthostatic negatives - carotid dopplers negative for ICA stenosis - Echocardiogram showing EF of 50-55% with grade 1 DD  Continue home regimen prior to admission for known other chronic conditions.  Procedures:  As  Listed above and below  Consultations:  None  Discharge Exam: Filed Vitals:   10/19/13 1401  BP: 154/56  Pulse: 81  Temp: 97.2 F (36.2 C)  Resp: 20    General: Pt in NAD, alert and awake Cardiovascular: RRR, no MRG Respiratory: CTA BL, no wheezes  Discharge Instructions You were cared for by a hospitalist during your hospital stay. If you have any questions about your discharge medications or the care you received while you were in the hospital after you are discharged, you can call the unit and asked to speak with the hospitalist on call if the hospitalist that took care of you is not available. Once you are discharged, your primary care physician will handle any further medical issues. Please note that NO REFILLS for any discharge medications will be  authorized once you are discharged, as it is imperative that you return to your primary care physician (or establish a relationship with a primary care physician if you do not have one) for your aftercare needs so that they can reassess your need for medications and monitor your lab values.  Discharge Orders   Future Orders Complete By Expires   Call MD for:  extreme fatigue  As directed    Call MD for:  persistant dizziness or light-headedness  As directed    Call MD for:  temperature >100.4  As directed    Diet - low sodium heart healthy  As directed    Discharge instructions  As directed    Increase activity slowly  As directed        Medication List         aspirin EC 81 MG tablet  Take 81 mg by mouth every morning.     bimatoprost 0.01 % Soln  Commonly known as:  LUMIGAN  Place 1 drop into both eyes at bedtime.     BIOFLAVONOIDS PO  Take 1 capsule by mouth 3 (three) times daily after meals.     calcium-vitamin D 500-200 MG-UNIT per tablet  Commonly known as:  OSCAL WITH D  Take 1 tablet by mouth every morning.     Calcium-Vitamin D-Vitamin K 500-100-40 MG-UNT-MCG Chew  Chew 1 tablet by mouth every morning.     dorzolamide-timolol 22.3-6.8 MG/ML ophthalmic solution  Commonly known as:  COSOPT  Place 1 drop into both eyes 2 (  two) times daily.     fish oil-omega-3 fatty acids 1000 MG capsule  Take 1 g by mouth 2 (two) times daily.     meclizine 12.5 MG tablet  Commonly known as:  ANTIVERT  Take 1 tablet (12.5 mg total) by mouth 3 (three) times daily as needed for dizziness or nausea (please take it schedule for the first 5 days; then use PRN).     valsartan 160 MG tablet  Commonly known as:  DIOVAN  Take 160 mg by mouth daily.       Allergies  Allergen Reactions  . Amoxicillin Nausea Only      The results of significant diagnostics from this hospitalization (including imaging, microbiology, ancillary and laboratory) are listed below for reference.     Significant Diagnostic Studies: Ct Head Wo Contrast  10/18/2013   CLINICAL DATA:  Weakness and dizziness and decreased appetite  EXAM: CT HEAD WITHOUT CONTRAST  TECHNIQUE: Contiguous axial images were obtained from the base of the skull through the vertex without intravenous contrast.  COMPARISON:  CT SINUS LTD W/O CM dated 08/12/2004; CT HEAD W/O CM dated 03/04/2013  FINDINGS: There is mild diffuse cerebral and cerebellar atrophy with compensatory ventriculomegaly. There is no intracranial hemorrhage nor intracranial mass effect. There is no evidence of an evolving ischemic infarction. The cerebellum and brainstem are normal in density. There are no abnormal intracranial calcifications.  At bone window settings there is no lytic or blastic lesion or evidence of an acute skull fracture. 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 The observed portions of the paranasal sinuses and mastoid air cells are clear.  IMPRESSION: There are mild age related changes present which appear stable. There is no evidence of an acute ischemic or hemorrhagic event. There is no intracranial mass effect or hydrocephalus.   Electronically Signed   By: David  [place]   On: 10/18/2013 08:17   Mr Jodene Nam Head Wo Contrast  10/19/2013   CLINICAL DATA:  Dizziness and weakness with decreased appetite. Hypertension. Trauma 2012.  EXAM: MRI HEAD WITHOUT CONTRAST  MRA HEAD WITHOUT CONTRAST  TECHNIQUE: Multiplanar, multiecho pulse sequences of the brain and surrounding structures were obtained without intravenous contrast. Angiographic images of the head were obtained using MRA technique without contrast.  COMPARISON:  10/18/2013 CT.  02/12/2011 MR.  FINDINGS: MRI HEAD FINDINGS  No acute infarct.  Small area of blood breakdown products adjacent to the atrium of the right lateral ventricle unchanged and may be related to prior hemorrhage ischemia or trauma. No new intracranial hemorrhage detected.  Mild small vessel disease type  changes.  Global atrophy without hydrocephalus.  No intracranial mass lesion noted on this unenhanced exam.  Major intracranial vascular structures are patent.  Cervical medullary junction, pituitary region, pineal region and orbital structures unremarkable.  MRA HEAD FINDINGS  No medium or large size vessel significant stenosis or occlusion.  Right posterior inferior cerebellar artery and left anterior inferior cerebellar artery not visualized. This may be related to reciprocal relationship with well visualized right anterior inferior cerebellar artery and left posterior inferior cerebellar artery.  Fetal type contribution to the right posterior cerebral artery.  Mild branch vessel irregularity.  No aneurysm detected.  IMPRESSION: MR brain:  No acute infarct.  Mild small vessel disease type changes.  Global atrophy without hydrocephalus.  MR angiogram:  No medium or large size vessel significant stenosis or occlusion.  Please see above.   Electronically Signed   By: Mignon Pine.D.  On: 10/19/2013 08:23   Mri Brain Without Contrast  10/19/2013   CLINICAL DATA:  Dizziness and weakness with decreased appetite. Hypertension. Trauma 2012.  EXAM: MRI HEAD WITHOUT CONTRAST  MRA HEAD WITHOUT CONTRAST  TECHNIQUE: Multiplanar, multiecho pulse sequences of the brain and surrounding structures were obtained without intravenous contrast. Angiographic images of the head were obtained using MRA technique without contrast.  COMPARISON:  10/18/2013 CT.  02/12/2011 MR.  FINDINGS: MRI HEAD FINDINGS  No acute infarct.  Small area of blood breakdown products adjacent to the atrium of the right lateral ventricle unchanged and may be related to prior hemorrhage ischemia or trauma. No new intracranial hemorrhage detected.  Mild small vessel disease type changes.  Global atrophy without hydrocephalus.  No intracranial mass lesion noted on this unenhanced exam.  Major intracranial vascular structures are patent.  Cervical medullary  junction, pituitary region, pineal region and orbital structures unremarkable.  MRA HEAD FINDINGS  No medium or large size vessel significant stenosis or occlusion.  Right posterior inferior cerebellar artery and left anterior inferior cerebellar artery not visualized. This may be related to reciprocal relationship with well visualized right anterior inferior cerebellar artery and left posterior inferior cerebellar artery.  Fetal type contribution to the right posterior cerebral artery.  Mild branch vessel irregularity.  No aneurysm detected.  IMPRESSION: MR brain:  No acute infarct.  Mild small vessel disease type changes.  Global atrophy without hydrocephalus.  MR angiogram:  No medium or large size vessel significant stenosis or occlusion.  Please see above.   Electronically Signed   By: Chauncey Cruel M.D.   On: 10/19/2013 08:23    Microbiology: No results found for this or any previous visit (from the past 240 hour(s)).   Labs: Basic Metabolic Panel:  Recent Labs Lab 10/18/13 0756  NA 131*  K 4.1  CL 94*  CO2 27  GLUCOSE 103*  BUN 11  CREATININE 0.72  CALCIUM 9.4   Liver Function Tests:  Recent Labs Lab 10/18/13 0756  AST 25  ALT 7  ALKPHOS 56  BILITOT 0.5  PROT 6.4  ALBUMIN 3.2*   No results found for this basename: LIPASE, AMYLASE,  in the last 168 hours No results found for this basename: AMMONIA,  in the last 168 hours CBC:  Recent Labs Lab 10/18/13 0756  WBC 6.5  HGB 13.4  HCT 37.7  MCV 91.5  PLT 314   Cardiac Enzymes:  Recent Labs Lab 10/18/13 0756  TROPONINI <0.30   BNP: BNP (last 3 results) No results found for this basename: PROBNP,  in the last 8760 hours CBG:  Recent Labs Lab 10/18/13 2305  GLUCAP 115*       Signed:  [place]  [facility] 10/19/2013, 3:44 PM

## 2013-10-19 NOTE — Progress Notes (Signed)
UR completed 

## 2013-11-24 DIAGNOSIS — H01029 Squamous blepharitis unspecified eye, unspecified eyelid: Secondary | ICD-10-CM | POA: Diagnosis not present

## 2013-12-28 ENCOUNTER — Encounter: Payer: Self-pay | Admitting: *Deleted

## 2014-01-06 ENCOUNTER — Emergency Department (HOSPITAL_COMMUNITY)
Admission: EM | Admit: 2014-01-06 | Discharge: 2014-01-06 | Disposition: A | Discharge status: Home / Self Care | Payer: Medicare Other | Attending: Emergency Medicine | Admitting: Emergency Medicine

## 2014-01-06 ENCOUNTER — Emergency Department (HOSPITAL_COMMUNITY): Payer: Medicare Other

## 2014-01-06 ENCOUNTER — Encounter (HOSPITAL_COMMUNITY): Payer: Self-pay | Admitting: Emergency Medicine

## 2014-01-06 DIAGNOSIS — R4182 Altered mental status, unspecified: Secondary | ICD-10-CM | POA: Diagnosis not present

## 2014-01-06 DIAGNOSIS — Z8659 Personal history of other mental and behavioral disorders: Secondary | ICD-10-CM | POA: Insufficient documentation

## 2014-01-06 DIAGNOSIS — I10 Essential (primary) hypertension: Secondary | ICD-10-CM | POA: Diagnosis not present

## 2014-01-06 DIAGNOSIS — R404 Transient alteration of awareness: Secondary | ICD-10-CM | POA: Diagnosis not present

## 2014-01-06 DIAGNOSIS — R42 Dizziness and giddiness: Secondary | ICD-10-CM | POA: Diagnosis not present

## 2014-01-06 DIAGNOSIS — H6120 Impacted cerumen, unspecified ear: Secondary | ICD-10-CM | POA: Diagnosis not present

## 2014-01-06 DIAGNOSIS — Z7982 Long term (current) use of aspirin: Secondary | ICD-10-CM | POA: Diagnosis not present

## 2014-01-06 DIAGNOSIS — Z8719 Personal history of other diseases of the digestive system: Secondary | ICD-10-CM | POA: Insufficient documentation

## 2014-01-06 DIAGNOSIS — Z880 Allergy status to penicillin: Secondary | ICD-10-CM | POA: Insufficient documentation

## 2014-01-06 DIAGNOSIS — N390 Urinary tract infection, site not specified: Secondary | ICD-10-CM | POA: Insufficient documentation

## 2014-01-06 DIAGNOSIS — E7800 Pure hypercholesterolemia, unspecified: Secondary | ICD-10-CM | POA: Diagnosis not present

## 2014-01-06 DIAGNOSIS — M810 Age-related osteoporosis without current pathological fracture: Secondary | ICD-10-CM | POA: Insufficient documentation

## 2014-01-06 DIAGNOSIS — H409 Unspecified glaucoma: Secondary | ICD-10-CM | POA: Insufficient documentation

## 2014-01-06 DIAGNOSIS — Z79899 Other long term (current) drug therapy: Secondary | ICD-10-CM | POA: Insufficient documentation

## 2014-01-06 DIAGNOSIS — H6123 Impacted cerumen, bilateral: Secondary | ICD-10-CM

## 2014-01-06 LAB — COMPREHENSIVE METABOLIC PANEL
ALT: 8 U/L (ref 0–35)
AST: 25 U/L (ref 0–37)
Albumin: 3.4 g/dL — ABNORMAL LOW (ref 3.5–5.2)
Alkaline Phosphatase: 42 U/L (ref 39–117)
Anion gap: 13 (ref 5–15)
BUN: 9 mg/dL (ref 6–23)
CO2: 23 meq/L (ref 19–32)
Calcium: 9.4 mg/dL (ref 8.4–10.5)
Chloride: 93 mEq/L — ABNORMAL LOW (ref 96–112)
Creatinine, Ser: 0.6 mg/dL (ref 0.50–1.10)
GFR calc Af Amer: >90 mL/min (ref >90)
GFR, EST NON AFRICAN AMERICAN: 78 mL/min — AB (ref >90)
Glucose, Bld: 102 mg/dL — ABNORMAL HIGH (ref 70–99)
Potassium: 4.2 mEq/L (ref 3.7–5.3)
SODIUM: 129 meq/L — AB (ref 137–147)
Total Bilirubin: 0.4 mg/dL (ref 0.3–1.2)
Total Protein: 6.3 g/dL (ref 6.0–8.3)

## 2014-01-06 LAB — URINALYSIS, ROUTINE W REFLEX MICROSCOPIC
Bilirubin Urine: NEGATIVE
Glucose, UA: NEGATIVE mg/dL
Hgb urine dipstick: NEGATIVE
KETONES UR: NEGATIVE mg/dL
Nitrite: POSITIVE — AB
PROTEIN: NEGATIVE mg/dL
Specific Gravity, Urine: 1.011 (ref 1.005–1.030)
UROBILINOGEN UA: 0.2 mg/dL (ref 0.0–1.0)
pH: 7 (ref 5.0–8.0)

## 2014-01-06 LAB — CBC WITH DIFFERENTIAL/PLATELET
BASOS ABS: 0 10*3/uL (ref 0.0–0.1)
BASOS PCT: 1 % (ref 0–1)
EOS PCT: 1 % (ref 0–5)
Eosinophils Absolute: 0.1 10*3/uL (ref 0.0–0.7)
HCT: 37.5 % (ref 36.0–46.0)
Hemoglobin: 13.4 g/dL (ref 12.0–15.0)
LYMPHS PCT: 36 % (ref 12–46)
Lymphs Abs: 2.4 10*3/uL (ref 0.7–4.0)
MCH: 32.5 pg (ref 26.0–34.0)
MCHC: 35.7 g/dL (ref 30.0–36.0)
MCV: 91 fL (ref 78.0–100.0)
Monocytes Absolute: 0.8 10*3/uL (ref 0.1–1.0)
Monocytes Relative: 13 % — ABNORMAL HIGH (ref 3–12)
Neutro Abs: 3.2 10*3/uL (ref 1.7–7.7)
Neutrophils Relative %: 49 % (ref 43–77)
PLATELETS: 280 10*3/uL (ref 150–400)
RBC: 4.12 MIL/uL (ref 3.87–5.11)
RDW: 12.9 % (ref 11.5–15.5)
WBC: 6.6 10*3/uL (ref 4.0–10.5)

## 2014-01-06 LAB — URINE MICROSCOPIC-ADD ON

## 2014-01-06 MED ORDER — MECLIZINE HCL 12.5 MG PO TABS: 12.5000 mg | ORAL_TABLET | Freq: Three times a day (TID) | ORAL | Status: AC

## 2014-01-06 MED ORDER — CEPHALEXIN 500 MG PO CAPS: 500.0000 mg | ORAL_CAPSULE | Freq: Two times a day (BID) | ORAL | Status: AC

## 2014-01-06 MED ORDER — DEXTROSE 5 % IV SOLN
1.0000 g | Freq: Once | INTRAVENOUS | Status: AC
  Administered 2014-01-06: 1 g via INTRAVENOUS
  Filled 2014-01-06: qty 10

## 2014-01-06 MED ORDER — ANTIPYRINE-BENZOCAINE 5.4-1.4 % OT SOLN
3.0000 [drp] | Freq: Once | OTIC | Status: AC
Start: 2014-01-06 — End: 2014-01-06
  Administered 2014-01-06: 3 [drp] via OTIC
  Filled 2014-01-06: qty 10

## 2014-01-06 MED ORDER — SODIUM CHLORIDE 0.9 % IV SOLN
Freq: Once | INTRAVENOUS | Status: AC
  Administered 2014-01-06: 18:00:00 via INTRAVENOUS

## 2014-01-06 MED ORDER — MECLIZINE HCL 25 MG PO TABS
12.5000 mg | ORAL_TABLET | Freq: Once | ORAL | Status: AC
  Administered 2014-01-06: 12.5 mg via ORAL
  Filled 2014-01-06: qty 1

## 2014-01-06 NOTE — ED Notes (Signed)
Bed: TY60 Expected date:  Expected time:  Means of arrival:  Comments: EMS/ear pain/h/a

## 2014-01-06 NOTE — ED Notes (Addendum)
Per EMS, pt has had pain and "humming"  in both ears since Thursday of this week. Pt states she fell 3 years ago and has had ear pain, dizziness, weakness, and vertigo since that time. Pt also has HTN, for which she takes valsartan as needed. Pt's husband states that the pt has not had high blood pressure for over a month and has not needed to take her medication. Pt is A&O upon arrival.

## 2014-01-06 NOTE — Discharge Instructions (Signed)
As discussed, it is important that you follow up as soon as possible with your physicians for continued management of your condition.  Please continue to use the provided ear drops as discussed.  If you develop any new, or concerning changes in your condition, please return to the emergency department immediately.

## 2014-01-06 NOTE — ED Provider Notes (Signed)
CSN: [id number]     Arrival date & time 01/06/14  1500 History   First MD Initiated Contact with Patient 01/06/14 1504     Chief Complaint  Patient presents with  . Otalgia      HPI  Patient presents with concerns increasing ear discomfort, tenderness, dizziness, headache. Patient has extremely poor hearing, which complicates the exam. Family assists with the history. It seems as though the patient had a fall several years ago, since that time has had persistent pain about the head. She also has persistent tinnitus, this seems unchanged She now c/o increasing unsteadiness x3d.  No clear precipitant.  Sx worse w motion. Patient was asked about meclizine, which is on her med list, and does not acknowledge using it previously. She also did not f/u w ENT, as directed after admission in April, 2015.    Past Medical History  Diagnosis Date  . Hypertension   . Glaucoma   . Osteoporosis   . Chronic cough     evaluated by Dr. Baird Lyons  . Hypercholesterolemia     goal  LDL cholesterol less than 130  . SIADH (syndrome of inappropriate ADH production)     sodium 124-128  . LBBB (left bundle branch block)   . Depression with somatization   . Allergic rhinitis   . Costochondritis   . GERD (gastroesophageal reflux disease)   . Memory loss     15/30 on 04-2013 probable Alzheimer's    Past Surgical History  Procedure Laterality Date  . Appendectomy    . Abdominal surgery      stomach wrap for reflux, surgery for "locked bowels"  . Cataract extraction    . US echocardiography  04-2009    Moderate aortic and mitral regurg  . Colonoscopy  01-2002   No family history on file. History  Substance Use Topics  . Smoking status: Never Smoker   . Smokeless tobacco: Never Used  . Alcohol Use: No   OB History   Grav Para Term Preterm Abortions TAB SAB Ect Mult Living                 Review of Systems  Constitutional: Negative for fever and chills.  HENT: Positive for ear pain,  hearing loss and tinnitus. Negative for sore throat.   Eyes: Negative for discharge.  Cardiovascular: Negative for chest pain.  Endocrine: Negative for polyuria.  Genitourinary: Negative.   Musculoskeletal: Negative for neck pain and neck stiffness.  Skin: Negative for color change, pallor and rash.  Neurological: Positive for dizziness and headaches. Negative for tremors, seizures, syncope, facial asymmetry, speech difficulty, weakness, light-headedness and numbness.  Psychiatric/Behavioral: Negative for confusion.      Allergies  Amoxicillin  Home Medications   Prior to Admission medications   Medication Sig Start Date End Date Taking? Authorizing Provider  aspirin EC 81 MG tablet Take 81 mg by mouth every morning.    Historical Provider, MD  bimatoprost (LUMIGAN) 0.01 % SOLN Place 1 drop into both eyes at bedtime.     Historical Provider, MD  BIOFLAVONOIDS PO Take 1 capsule by mouth 3 (three) times daily after meals.    Historical Provider, MD  calcium-vitamin D (OSCAL WITH D) 500-200 MG-UNIT per tablet Take 1 tablet by mouth every morning.    Historical Provider, MD  Calcium-Vitamin D-Vitamin K 500-100-40 MG-UNT-MCG CHEW Chew 1 tablet by mouth every morning.    Historical Provider, MD  dorzolamide-timolol (COSOPT) 22.3-6.8 MG/ML ophthalmic solution Place 1 drop into  both eyes 2 (two) times daily.    Historical Provider, MD  fish oil-omega-3 fatty acids 1000 MG capsule Take 1 g by mouth 2 (two) times daily.    Historical Provider, MD  meclizine (ANTIVERT) 12.5 MG tablet Take 1 tablet (12.5 mg total) by mouth 3 (three) times daily as needed for dizziness or nausea (please take it schedule for the first 5 days; then use PRN). 03/06/13   Barton Dubois, MD  valsartan (DIOVAN) 160 MG tablet Take 160 mg by mouth daily.    Historical Provider, MD   BP 192/73  Pulse 59  Temp(Src) 97.9 F (36.6 C) (Oral)  Resp 15  SpO2 97% Physical Exam  Nursing note and vitals reviewed. Constitutional:  She is oriented to person, place, and time. She appears well-developed and well-nourished. No distress.  HENT:  Head: Normocephalic and atraumatic.  Nose: Nose normal.  Mouth/Throat: Uvula is midline.  Wax deep in both ear canals, each of which has cotton packed externally to the wax.  Patient indicates pain at superior occiput.    Eyes: Conjunctivae and EOM are normal.  Cardiovascular: Normal rate and regular rhythm.   Pulmonary/Chest: Effort normal and breath sounds normal. No stridor. No respiratory distress.  Abdominal: She exhibits no distension.  Musculoskeletal: She exhibits no edema.  Neurological: She is alert and oriented to person, place, and time. She displays atrophy. She displays no tremor. A cranial nerve deficit is present. No sensory deficit. She displays no seizure activity. Coordination abnormal.  Extremely poor hearing - complicates the neuro exam. Slow finger-nose w repetitive motions.   Skin: Skin is warm and dry.  Psychiatric: She has a normal mood and affect.    ED Course  Procedures (including critical care time) Labs Review Labs Reviewed  URINALYSIS, ROUTINE W REFLEX MICROSCOPIC - Abnormal; Notable for the following:    APPearance CLOUDY (*)    Nitrite POSITIVE (*)    Leukocytes, UA LARGE (*)    All other components within normal limits  CBC WITH DIFFERENTIAL - Abnormal; Notable for the following:    Monocytes Relative 13 (*)    All other components within normal limits  COMPREHENSIVE METABOLIC PANEL - Abnormal; Notable for the following:    Sodium 129 (*)    Chloride 93 (*)    Glucose, Bld 102 (*)    Albumin 3.4 (*)    GFR calc non Af Amer 78 (*)    All other components within normal limits  URINE MICROSCOPIC-ADD ON - Abnormal; Notable for the following:    Bacteria, UA MANY (*)    All other components within normal limits    Imaging Review Ct Head Wo Contrast  01/06/2014   CLINICAL DATA:  Altered mental status  EXAM: CT HEAD WITHOUT CONTRAST   TECHNIQUE: Contiguous axial images were obtained from the base of the skull through the vertex without intravenous contrast.  COMPARISON:  10/18/2013  FINDINGS: The bony calvarium is intact. No gross soft tissue abnormality is noted. Generalized atrophy and chronic white matter ischemic change is again seen and stable. No findings to suggest acute hemorrhage, acute infarction or space-occupying mass lesion are noted.  IMPRESSION: Chronic changes without acute abnormality. The overall appearance is stable from the prior exam.   Electronically Signed   By: Inez Catalina M.D.   On: 01/06/2014 16:25   I reviewed the EMR, including ct, mr, echo, carotid dopplers from two months ago.  Update: Without evidence for UTI, hyponatremia, she fluid resuscitation, initial antibiotics.  7:27 PM Patient is awake alert, with substantially reduced symptoms. MDM   Final diagnoses:  Vertigo  Cerumen impaction, bilateral    This elderly female with previous episodes of vertigo now presents with dizziness.  Given her prescription no new pain as well, her age, comorbidities, CT scans performed in addition to labs. Patient's introduction symptoms here, was discharged in stable condition to follow up with primary care and ENT.     Carmin Muskrat, MD 01/06/14 Kathyrn Drown

## 2014-01-08 DIAGNOSIS — H6120 Impacted cerumen, unspecified ear: Secondary | ICD-10-CM | POA: Diagnosis not present

## 2014-01-08 DIAGNOSIS — R413 Other amnesia: Secondary | ICD-10-CM | POA: Diagnosis not present

## 2014-01-08 DIAGNOSIS — I10 Essential (primary) hypertension: Secondary | ICD-10-CM | POA: Diagnosis not present

## 2014-01-10 DIAGNOSIS — H5710 Ocular pain, unspecified eye: Secondary | ICD-10-CM | POA: Diagnosis not present

## 2014-01-10 DIAGNOSIS — H02059 Trichiasis without entropian unspecified eye, unspecified eyelid: Secondary | ICD-10-CM | POA: Diagnosis not present

## 2014-01-18 DIAGNOSIS — H409 Unspecified glaucoma: Secondary | ICD-10-CM | POA: Diagnosis not present

## 2014-01-18 DIAGNOSIS — H4011X0 Primary open-angle glaucoma, stage unspecified: Secondary | ICD-10-CM | POA: Diagnosis not present

## 2014-01-23 DIAGNOSIS — H01029 Squamous blepharitis unspecified eye, unspecified eyelid: Secondary | ICD-10-CM | POA: Diagnosis not present

## 2014-01-23 DIAGNOSIS — H409 Unspecified glaucoma: Secondary | ICD-10-CM | POA: Diagnosis not present

## 2014-01-23 DIAGNOSIS — H4011X0 Primary open-angle glaucoma, stage unspecified: Secondary | ICD-10-CM | POA: Diagnosis not present

## 2014-01-24 DIAGNOSIS — R059 Cough, unspecified: Secondary | ICD-10-CM | POA: Diagnosis not present

## 2014-01-24 DIAGNOSIS — R05 Cough: Secondary | ICD-10-CM | POA: Diagnosis not present

## 2014-01-24 DIAGNOSIS — I10 Essential (primary) hypertension: Secondary | ICD-10-CM | POA: Diagnosis not present

## 2014-01-24 DIAGNOSIS — E46 Unspecified protein-calorie malnutrition: Secondary | ICD-10-CM | POA: Diagnosis not present

## 2014-01-24 DIAGNOSIS — N390 Urinary tract infection, site not specified: Secondary | ICD-10-CM | POA: Diagnosis not present

## 2014-02-09 DIAGNOSIS — H02059 Trichiasis without entropian unspecified eye, unspecified eyelid: Secondary | ICD-10-CM | POA: Diagnosis not present

## 2014-02-09 DIAGNOSIS — H5710 Ocular pain, unspecified eye: Secondary | ICD-10-CM | POA: Diagnosis not present

## 2014-02-20 DIAGNOSIS — H409 Unspecified glaucoma: Secondary | ICD-10-CM | POA: Diagnosis not present

## 2014-02-20 DIAGNOSIS — H4011X0 Primary open-angle glaucoma, stage unspecified: Secondary | ICD-10-CM | POA: Diagnosis not present

## 2014-03-13 DIAGNOSIS — H903 Sensorineural hearing loss, bilateral: Secondary | ICD-10-CM | POA: Diagnosis not present

## 2014-03-13 DIAGNOSIS — H9319 Tinnitus, unspecified ear: Secondary | ICD-10-CM | POA: Diagnosis not present

## 2014-03-26 DIAGNOSIS — Z23 Encounter for immunization: Secondary | ICD-10-CM | POA: Diagnosis not present

## 2014-07-03 DIAGNOSIS — H02055 Trichiasis without entropian left lower eyelid: Secondary | ICD-10-CM | POA: Diagnosis not present

## 2014-07-03 DIAGNOSIS — H4011X2 Primary open-angle glaucoma, moderate stage: Secondary | ICD-10-CM | POA: Diagnosis not present

## 2014-07-03 DIAGNOSIS — H02054 Trichiasis without entropian left upper eyelid: Secondary | ICD-10-CM | POA: Diagnosis not present

## 2014-08-06 DIAGNOSIS — K130 Diseases of lips: Secondary | ICD-10-CM | POA: Diagnosis not present

## 2014-08-06 DIAGNOSIS — H02055 Trichiasis without entropian left lower eyelid: Secondary | ICD-10-CM | POA: Diagnosis not present

## 2014-08-06 DIAGNOSIS — Z85828 Personal history of other malignant neoplasm of skin: Secondary | ICD-10-CM | POA: Diagnosis not present

## 2014-08-13 DIAGNOSIS — H6123 Impacted cerumen, bilateral: Secondary | ICD-10-CM | POA: Diagnosis not present

## 2014-08-13 DIAGNOSIS — H9313 Tinnitus, bilateral: Secondary | ICD-10-CM | POA: Diagnosis not present

## 2014-08-13 DIAGNOSIS — H903 Sensorineural hearing loss, bilateral: Secondary | ICD-10-CM | POA: Diagnosis not present

## 2014-09-24 DIAGNOSIS — H01001 Unspecified blepharitis right upper eyelid: Secondary | ICD-10-CM | POA: Diagnosis not present

## 2014-09-24 DIAGNOSIS — H01004 Unspecified blepharitis left upper eyelid: Secondary | ICD-10-CM | POA: Diagnosis not present

## 2014-09-24 DIAGNOSIS — H0289 Other specified disorders of eyelid: Secondary | ICD-10-CM | POA: Diagnosis not present

## 2014-11-02 DIAGNOSIS — H02055 Trichiasis without entropian left lower eyelid: Secondary | ICD-10-CM | POA: Diagnosis not present

## 2014-11-02 DIAGNOSIS — H4011X1 Primary open-angle glaucoma, mild stage: Secondary | ICD-10-CM | POA: Diagnosis not present

## 2014-12-25 IMAGING — CT CT HEAD W/O CM
2 series · 17 of 30 positions shown, 20 images · non-contrast
Comparison: 09/15/2010

CLINICAL DATA: Headache, weakness. Remote history of head trauma.

EXAM:
CT HEAD WITHOUT CONTRAST
TECHNIQUE: Contiguous axial images were obtained from the base of the skull
through the vertex without intravenous contrast.

[Series 2: head w/o · axial · non-contrast · 0.44mm/px · z∈[+1447,+1567]mm · 9 of 30 slices shown, 12 images]
[im 3/30  brain]
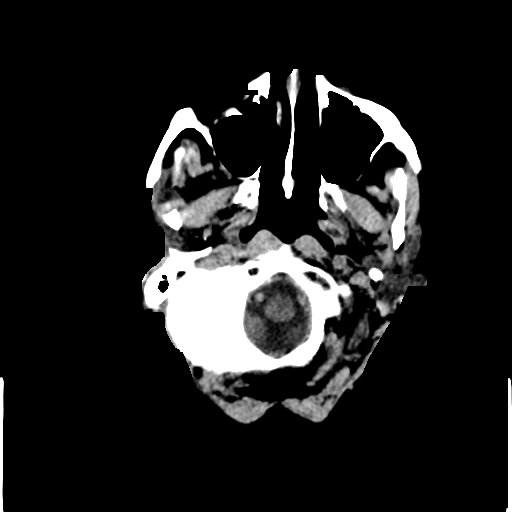
[im 3/30  bone]
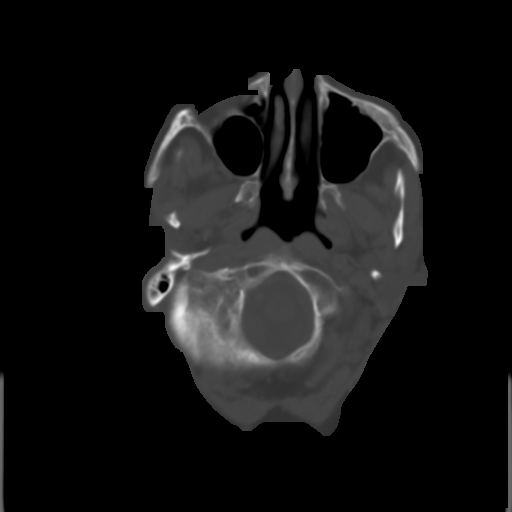
[im 6/30  brain]
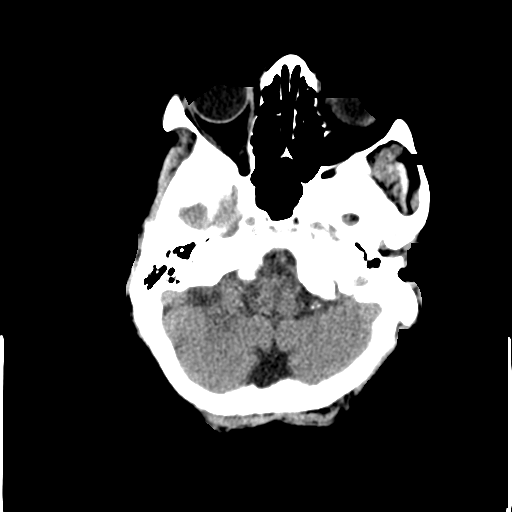
[im 9/30  brain]
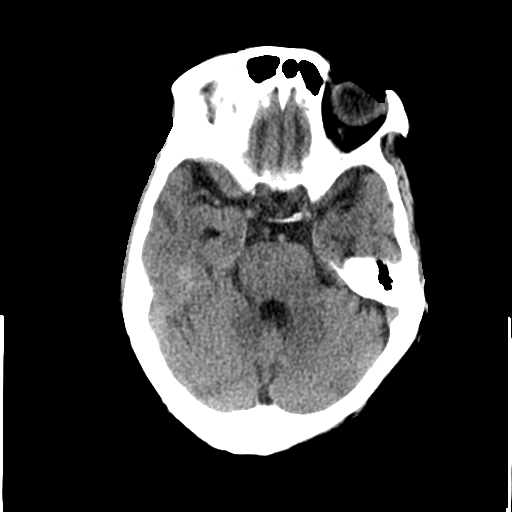
[im 12/30  brain]
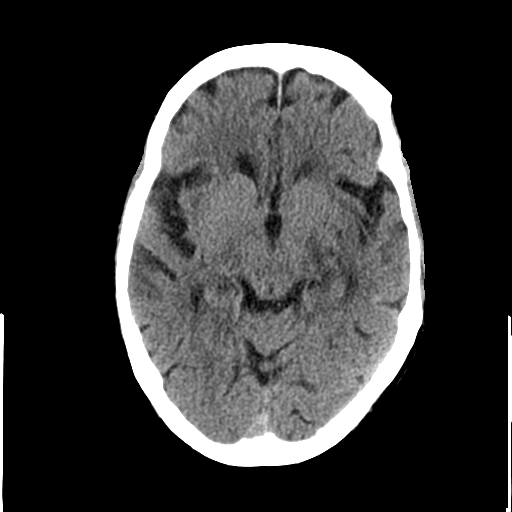
[im 15/30  brain]
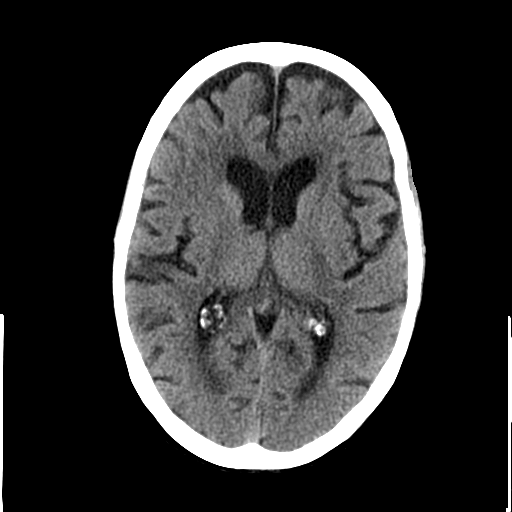
[im 15/30  bone]
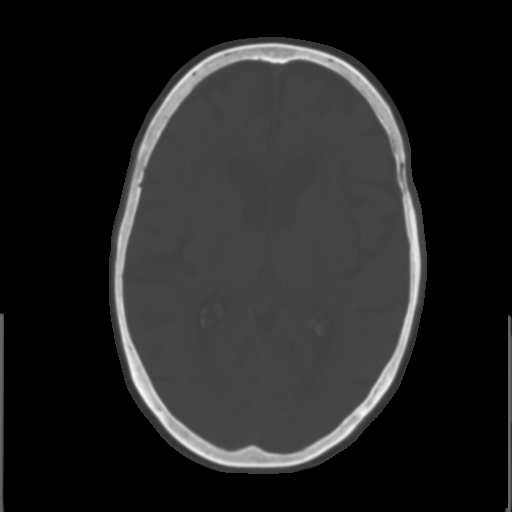
[im 18/30  brain]
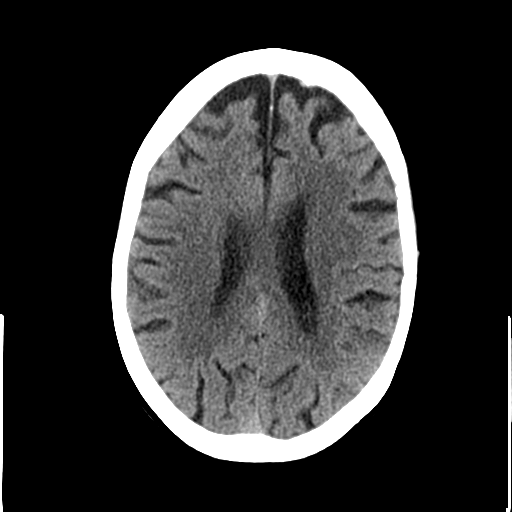
[im 21/30  brain]
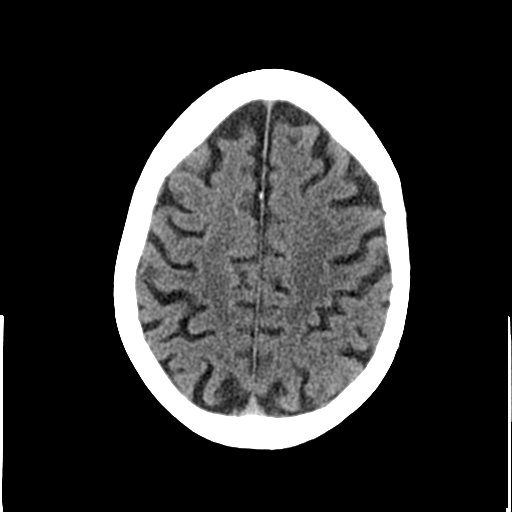
[im 24/30  brain]
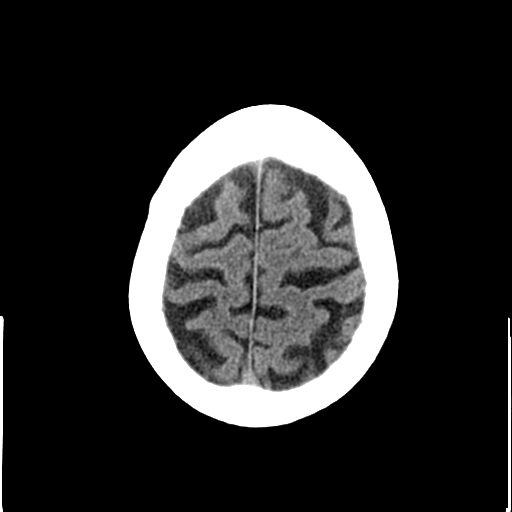
[im 27/30  brain]
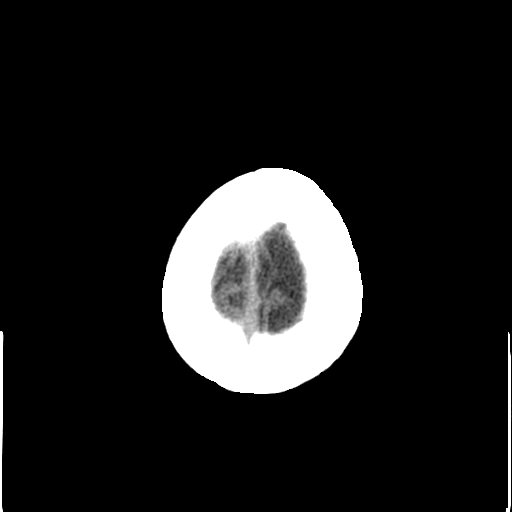
[im 27/30  bone]
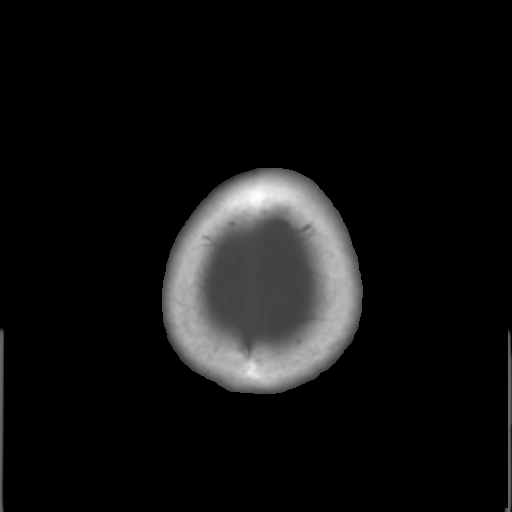

[Series 3: bone windows · axial · 0.44mm/px · z∈[+1452,+1566]mm · 8 of 50 slices shown]
[im 6/50  bone]
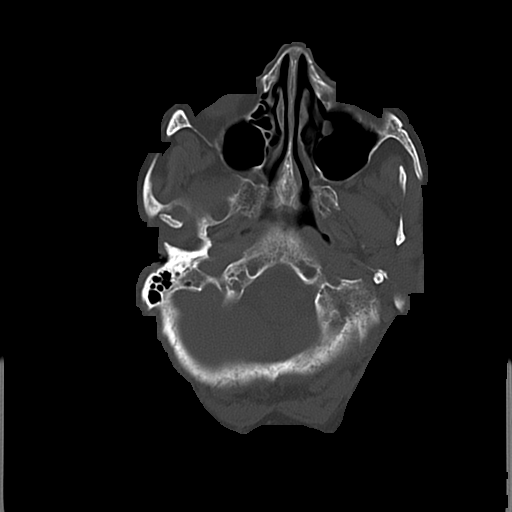
[im 11/50  bone]
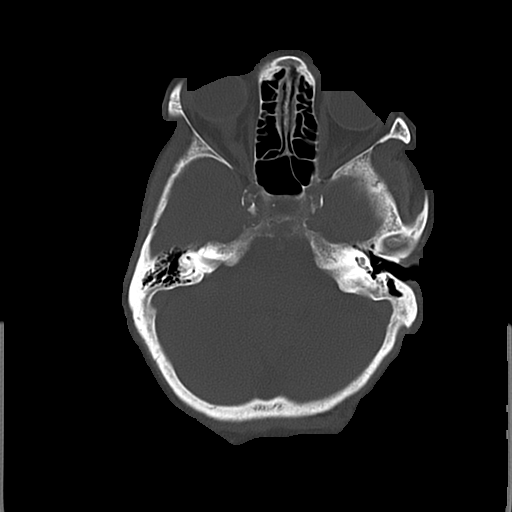
[im 17/50  bone]
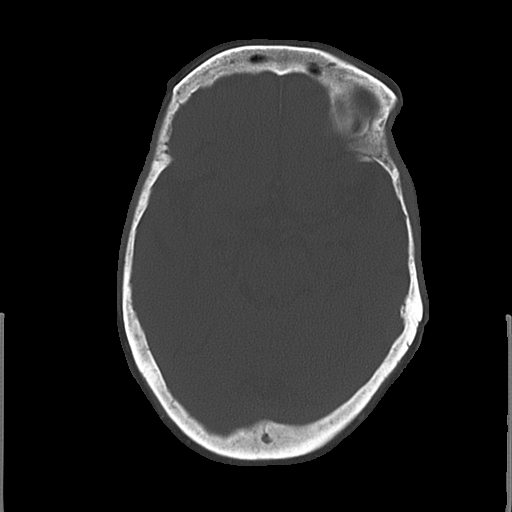
[im 22/50  bone]
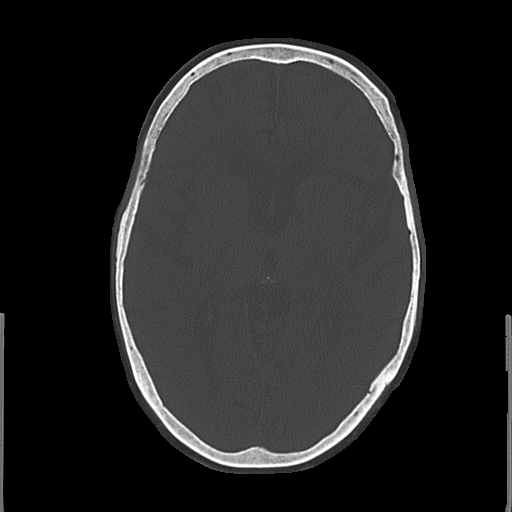
[im 28/50  bone]
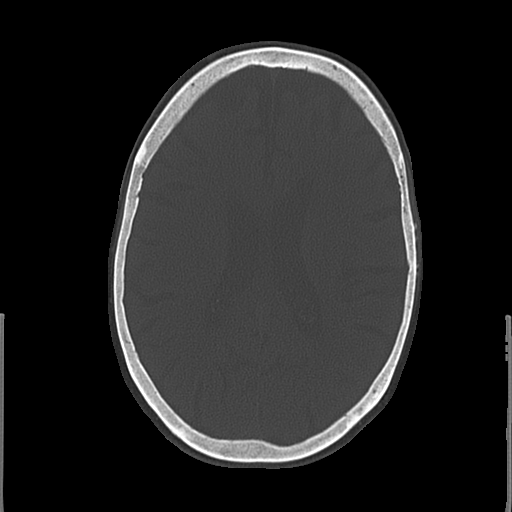
[im 33/50  bone]
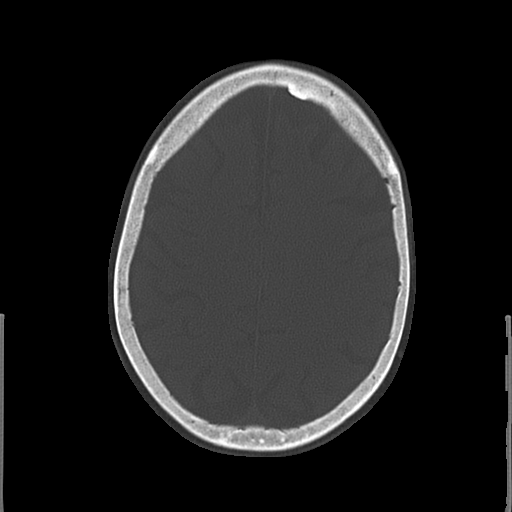
[im 39/50  bone]
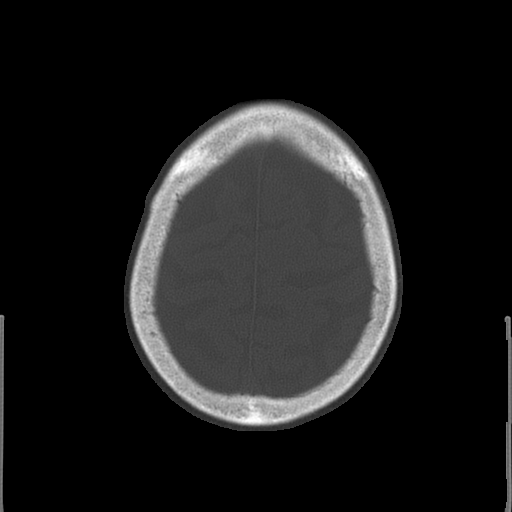
[im 44/50  bone]
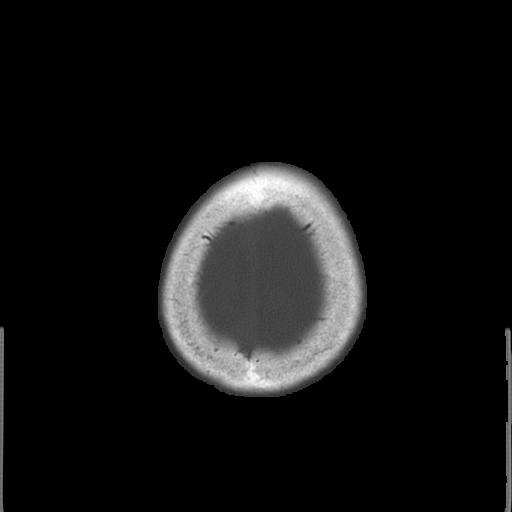

[17 of 30 positions shown; findings below may reference images not displayed]

FINDINGS: Mild age related volume loss. No acute intracranial abnormality.
Specifically, no hemorrhage, hydrocephalus, mass lesion, acute
infarction, or significant intracranial injury. No acute calvarial
abnormality. Visualized paranasal sinuses and mastoids clear.
Orbital soft tissues unremarkable.
IMPRESSION: No acute intracranial abnormality.

## 2015-01-11 DIAGNOSIS — H01003 Unspecified blepharitis right eye, unspecified eyelid: Secondary | ICD-10-CM | POA: Diagnosis not present

## 2015-01-11 DIAGNOSIS — H02055 Trichiasis without entropian left lower eyelid: Secondary | ICD-10-CM | POA: Diagnosis not present

## 2015-01-11 DIAGNOSIS — H04122 Dry eye syndrome of left lacrimal gland: Secondary | ICD-10-CM | POA: Diagnosis not present

## 2015-03-06 DIAGNOSIS — H01004 Unspecified blepharitis left upper eyelid: Secondary | ICD-10-CM | POA: Diagnosis not present

## 2015-03-06 DIAGNOSIS — H02052 Trichiasis without entropian right lower eyelid: Secondary | ICD-10-CM | POA: Diagnosis not present

## 2015-03-06 DIAGNOSIS — H01001 Unspecified blepharitis right upper eyelid: Secondary | ICD-10-CM | POA: Diagnosis not present

## 2015-03-06 DIAGNOSIS — H02055 Trichiasis without entropian left lower eyelid: Secondary | ICD-10-CM | POA: Diagnosis not present

## 2015-03-06 DIAGNOSIS — H01002 Unspecified blepharitis right lower eyelid: Secondary | ICD-10-CM | POA: Diagnosis not present

## 2015-03-06 DIAGNOSIS — H01005 Unspecified blepharitis left lower eyelid: Secondary | ICD-10-CM | POA: Diagnosis not present

## 2015-03-19 ENCOUNTER — Encounter (HOSPITAL_COMMUNITY): Payer: Self-pay

## 2015-03-19 ENCOUNTER — Emergency Department (HOSPITAL_COMMUNITY): Payer: Medicare Other

## 2015-03-19 ENCOUNTER — Emergency Department (HOSPITAL_COMMUNITY)
Admission: EM | Admit: 2015-03-19 | Discharge: 2015-03-19 | Disposition: A | Discharge status: Home / Self Care | Payer: Medicare Other | Attending: Emergency Medicine | Admitting: Emergency Medicine

## 2015-03-19 DIAGNOSIS — R531 Weakness: Secondary | ICD-10-CM | POA: Diagnosis not present

## 2015-03-19 DIAGNOSIS — J9811 Atelectasis: Secondary | ICD-10-CM | POA: Diagnosis not present

## 2015-03-19 DIAGNOSIS — Z8659 Personal history of other mental and behavioral disorders: Secondary | ICD-10-CM | POA: Diagnosis not present

## 2015-03-19 DIAGNOSIS — I10 Essential (primary) hypertension: Secondary | ICD-10-CM | POA: Insufficient documentation

## 2015-03-19 DIAGNOSIS — Z79899 Other long term (current) drug therapy: Secondary | ICD-10-CM | POA: Insufficient documentation

## 2015-03-19 DIAGNOSIS — H409 Unspecified glaucoma: Secondary | ICD-10-CM | POA: Insufficient documentation

## 2015-03-19 DIAGNOSIS — Z8639 Personal history of other endocrine, nutritional and metabolic disease: Secondary | ICD-10-CM | POA: Insufficient documentation

## 2015-03-19 DIAGNOSIS — R410 Disorientation, unspecified: Secondary | ICD-10-CM | POA: Diagnosis not present

## 2015-03-19 DIAGNOSIS — E869 Volume depletion, unspecified: Secondary | ICD-10-CM | POA: Diagnosis not present

## 2015-03-19 DIAGNOSIS — R4182 Altered mental status, unspecified: Secondary | ICD-10-CM | POA: Diagnosis present

## 2015-03-19 DIAGNOSIS — J90 Pleural effusion, not elsewhere classified: Secondary | ICD-10-CM | POA: Insufficient documentation

## 2015-03-19 DIAGNOSIS — Z7982 Long term (current) use of aspirin: Secondary | ICD-10-CM | POA: Insufficient documentation

## 2015-03-19 DIAGNOSIS — Z880 Allergy status to penicillin: Secondary | ICD-10-CM | POA: Insufficient documentation

## 2015-03-19 DIAGNOSIS — J449 Chronic obstructive pulmonary disease, unspecified: Secondary | ICD-10-CM | POA: Diagnosis not present

## 2015-03-19 DIAGNOSIS — Z8719 Personal history of other diseases of the digestive system: Secondary | ICD-10-CM | POA: Diagnosis not present

## 2015-03-19 DIAGNOSIS — M810 Age-related osteoporosis without current pathological fracture: Secondary | ICD-10-CM | POA: Insufficient documentation

## 2015-03-19 LAB — CBC WITH DIFFERENTIAL/PLATELET
BASOS PCT: 0 % (ref 0–1)
Basophils Absolute: 0 10*3/uL (ref 0.0–0.1)
EOS ABS: 0.1 10*3/uL (ref 0.0–0.7)
Eosinophils Relative: 1 % (ref 0–5)
HCT: 37.9 % (ref 36.0–46.0)
HEMOGLOBIN: 12.7 g/dL (ref 12.0–15.0)
Lymphocytes Relative: 43 % (ref 12–46)
Lymphs Abs: 3.1 10*3/uL (ref 0.7–4.0)
MCH: 32.6 pg (ref 26.0–34.0)
MCHC: 33.5 g/dL (ref 30.0–36.0)
MCV: 97.4 fL (ref 78.0–100.0)
MONOS PCT: 12 % (ref 3–12)
Monocytes Absolute: 0.9 10*3/uL (ref 0.1–1.0)
NEUTROS PCT: 44 % (ref 43–77)
Neutro Abs: 3.2 10*3/uL (ref 1.7–7.7)
Platelets: 327 10*3/uL (ref 150–400)
RBC: 3.89 MIL/uL (ref 3.87–5.11)
RDW: 13.1 % (ref 11.5–15.5)
WBC: 7.4 10*3/uL (ref 4.0–10.5)

## 2015-03-19 LAB — URINALYSIS, ROUTINE W REFLEX MICROSCOPIC
Bilirubin Urine: NEGATIVE
GLUCOSE, UA: NEGATIVE mg/dL
HGB URINE DIPSTICK: NEGATIVE
Ketones, ur: NEGATIVE mg/dL
Leukocytes, UA: NEGATIVE
Nitrite: NEGATIVE
PH: 7 (ref 5.0–8.0)
PROTEIN: NEGATIVE mg/dL
Specific Gravity, Urine: 1.018 (ref 1.005–1.030)
Urobilinogen, UA: 1 mg/dL (ref 0.0–1.0)

## 2015-03-19 LAB — BASIC METABOLIC PANEL
Anion gap: 6 (ref 5–15)
BUN: 14 mg/dL (ref 6–20)
CALCIUM: 8.8 mg/dL — AB (ref 8.9–10.3)
CO2: 27 mmol/L (ref 22–32)
CREATININE: 0.79 mg/dL (ref 0.44–1.00)
Chloride: 101 mmol/L (ref 101–111)
GFR calc non Af Amer: >60 mL/min (ref >60)
Glucose, Bld: 102 mg/dL — ABNORMAL HIGH (ref 65–99)
Potassium: 4.1 mmol/L (ref 3.5–5.1)
SODIUM: 134 mmol/L — AB (ref 135–145)

## 2015-03-19 LAB — I-STAT TROPONIN, ED: Troponin i, poc: 0.01 ng/mL (ref 0.00–0.08)

## 2015-03-19 LAB — I-STAT CG4 LACTIC ACID, ED: Lactic Acid, Venous: 0.91 mmol/L (ref 0.5–2.0)

## 2015-03-19 MED ORDER — IOHEXOL 300 MG/ML  SOLN
75.0000 mL | Freq: Once | INTRAMUSCULAR | Status: AC | PRN
  Administered 2015-03-19: 75 mL via INTRAVENOUS

## 2015-03-19 MED ORDER — SODIUM CHLORIDE 0.9 % IV BOLUS (SEPSIS)
500.0000 mL | Freq: Once | INTRAVENOUS | Status: AC
  Administered 2015-03-19: 500 mL via INTRAVENOUS

## 2015-03-19 NOTE — ED Notes (Signed)
[facility] ems family states pt with increased confusion last pm, return to baseline at this time. Husband states pt decrease intact concerned about dehydration. Respirations easy non labored, MAE randomly.

## 2015-03-19 NOTE — ED Provider Notes (Signed)
CSN: [id number]     Arrival date & time 03/19/15  1335 History   First MD Initiated Contact with Patient 03/19/15 1459     Chief Complaint  Patient presents with  . Altered Mental Status     (Consider location/radiation/quality/duration/timing/severity/associated sxs/prior Treatment) HPI  Natasha David is a 81 yo female who presents today with AMS. History was obtained from patient's family. Family states that she has not been eating or drinking much for the past couple of days. They say that she has been staying in bed all day. Her husband states that last night and this morning she was hallucinating. He states that she was seeing people crawling on the walls. This has never happened before. She has not taken any new medications recently. She has no recent weight loss. Patient states she has no pain anywhere and she has not felt feverish.  Patient denies chest pain, shortness of breath, nausea, vomiting, blurred vision, headache, incontinence, diarrhea, abdominal pain, or syncope  Past Medical History  Diagnosis Date  . Hypertension   . Glaucoma   . Osteoporosis   . Chronic cough     evaluated by Dr. Baird Lyons  . Hypercholesterolemia     goal  LDL cholesterol less than 130  . SIADH (syndrome of inappropriate ADH production)     sodium 124-128  . LBBB (left bundle branch block)   . Depression with somatization   . Allergic rhinitis   . Costochondritis   . GERD (gastroesophageal reflux disease)   . Memory loss     15/30 on 04-2013 probable Alzheimer's    Past Surgical History  Procedure Laterality Date  . Appendectomy    . Abdominal surgery      stomach wrap for reflux, surgery for "locked bowels"  . Cataract extraction    . US echocardiography  04-2009    Moderate aortic and mitral regurg  . Colonoscopy  01-2002   No family history on file. Social History  Substance Use Topics  . Smoking status: Never Smoker   . Smokeless tobacco: Never Used  . Alcohol Use: No   OB  History    No data available     Review of Systems  All other systems negative except as documented in the HPI. All pertinent positives and negatives as reviewed in the HPI.  Allergies  Amoxicillin  Home Medications   Prior to Admission medications   Medication Sig Start Date End Date Taking? Authorizing Provider  aspirin EC 81 MG tablet Take 81 mg by mouth every morning.   Yes Historical Provider, MD  bimatoprost (LUMIGAN) 0.01 % SOLN Place 1 drop into both eyes at bedtime.    Yes Historical Provider, MD  BIOFLAVONOIDS PO Take 1 capsule by mouth 3 (three) times daily after meals.   Yes Historical Provider, MD  calcium-vitamin D (OSCAL WITH D) 500-200 MG-UNIT per tablet Take 1 tablet by mouth every morning.   Yes Historical Provider, MD  dorzolamide-timolol (COSOPT) 22.3-6.8 MG/ML ophthalmic solution Place 1 drop into both eyes 2 (two) times daily.   Yes Historical Provider, MD  fish oil-omega-3 fatty acids 1000 MG capsule Take 1 g by mouth 2 (two) times daily.   Yes Historical Provider, MD  guaiFENesin-dextromethorphan (ROBITUSSIN DM) 100-10 MG/5ML syrup Take 5 mLs by mouth every 4 (four) hours as needed for cough.   Yes Historical Provider, MD  valsartan (DIOVAN) 160 MG tablet Take 160 mg by mouth daily as needed (for high blood pressure).    Yes  Historical Provider, MD   BP 144/48 mmHg  Pulse 65  Temp(Src) 97.9 F (36.6 C) (Rectal)  Resp 16  SpO2 100% Physical Exam  Constitutional: She appears well-developed and well-nourished. No distress.  HENT:  Head: Normocephalic and atraumatic.  Mouth/Throat: Oropharynx is clear and moist.  Eyes: Pupils are equal, round, and reactive to light.  Neck: Normal range of motion. Neck supple.  Cardiovascular: Normal rate, regular rhythm and normal heart sounds.  Exam reveals no gallop and no friction rub.   No murmur heard. Pulmonary/Chest: Effort normal and breath sounds normal. No respiratory distress.  Abdominal: Soft. Bowel sounds are  normal. She exhibits no distension. There is no tenderness.  Musculoskeletal: She exhibits no edema.  Neurological: She is alert. She exhibits normal muscle tone. Coordination normal.  Skin: Skin is warm and dry. No rash noted. No erythema.  Psychiatric: She has a normal mood and affect. Her behavior is normal.  Nursing note and vitals reviewed.   ED Course  Procedures (including critical care time) Labs Review Labs Reviewed  BASIC METABOLIC PANEL - Abnormal; Notable for the following:    Sodium 134 (*)    Glucose, Bld 102 (*)    Calcium 8.8 (*)    All other components within normal limits  URINE CULTURE  CBC WITH DIFFERENTIAL/PLATELET  URINALYSIS, ROUTINE W REFLEX MICROSCOPIC (NOT AT [facility])  I-STAT CG4 LACTIC ACID, ED  Randolm Idol, ED    Imaging Review Dg Chest 2 View  03/19/2015   CLINICAL DATA:  Increased confusion last night, returned to baseline today, decreased intake question dehydration, hypertension, SIADH, arrhythmia, GERD  EXAM: CHEST  2 VIEW  COMPARISON:  10/31/2012  FINDINGS: Rotated to the LEFT.  Upper normal heart size.  Atherosclerotic calcification aorta.  Mediastinal contours and pulmonary vascularity normal.  New moderate LEFT pleural effusion and LEFT basilar atelectasis.  Underlying COPD changes.  Remaining lungs clear.  No pneumothorax.  Bones demineralized.  IMPRESSION: COPD changes with new moderate-sized LEFT pleural effusion and LEFT basilar atelectasis.  Underlying abnormalities in the LEFT lower lobe not excluded with this appearance; followup assessment until resolution recommended.   Electronically Signed   By: Lavonia Dana M.D.   On: 03/19/2015 15:12   Ct Head Wo Contrast  03/19/2015   CLINICAL DATA:  Increased confusion last night. Possible dehydration. Initial encounter.  EXAM: CT HEAD WITHOUT CONTRAST  TECHNIQUE: Contiguous axial images were obtained from the base of the skull through the vertex without intravenous contrast.  COMPARISON:  Head CT  01/06/2014.  FINDINGS: There is no evidence of acute intracranial hemorrhage, mass lesion, brain edema or extra-axial fluid collection. The ventricles and subarachnoid spaces are appropriately sized for age. There is no CT evidence of acute cortical infarction. There is stable mild chronic periventricular white matter disease.  The visualized paranasal sinuses, mastoid air cells and middle ears are clear. The calvarium is intact.  IMPRESSION: Stable examination.  No acute intracranial findings.   Electronically Signed   By: Richardean Sale M.D.   On: 03/19/2015 15:54   I have personally reviewed and evaluated these images and lab results as part of my medical decision-making.   EKG Interpretation None     Patient will be referred back to her primary care doctor or testing here today was normal.  Family is advised with plan and all questions were answered.  They agreed to take her home and have her follow-up with her doctor.  The patient has no clear.  Findings would explain  her symptoms.  She does appear at baseline per family.  I do feel that this could be some dementia that has not been diagnosed as these symptoms have been ongoing for quite a while as far as the decreased activities    Dalia Heading, PA-C 03/19/15 1818

## 2015-03-19 NOTE — ED Notes (Signed)
Bed: [facility] Expected date:  Expected time:  Means of arrival:  Comments: EMS- 81yo F, AMS

## 2015-03-19 NOTE — ED Notes (Signed)
Patient transported to X-ray 

## 2015-03-19 NOTE — ED Notes (Signed)
Patient discharged with family. Discharge instructions reviewed. Mentation at baseline, ambulating, stable, respirations even and unlabored.

## 2015-03-19 NOTE — Discharge Instructions (Signed)
Return here as needed.  Follow-up with your primary care doctor °

## 2015-03-19 NOTE — ED Provider Notes (Signed)
Medical screening examination/treatment/procedure(s) were performed by non-physician practitioner and as supervising physician I was immediately available for consultation/collaboration.   EKG Interpretation None     Patient here with hallucinations are worse at night and have gone up quite some time. Family states that she is at her baseline at this time. On exam shows no focal findings. She does have evidence of a new effusion on the left side in her lungs. Spoke to family and they will follow with Dr. Felipa Eth.  Lacretia Leigh, MD 03/19/15 [phone]

## 2015-03-19 NOTE — ED Notes (Signed)
RN ADVISED THEY WOULD COLLECT BLOOD SAMPLES WHEN THEY STARTED IV

## 2015-03-21 LAB — URINE CULTURE

## 2015-05-10 DIAGNOSIS — H401131 Primary open-angle glaucoma, bilateral, mild stage: Secondary | ICD-10-CM | POA: Diagnosis not present

## 2015-05-10 DIAGNOSIS — H02055 Trichiasis without entropian left lower eyelid: Secondary | ICD-10-CM | POA: Diagnosis not present

## 2015-05-12 ENCOUNTER — Emergency Department (HOSPITAL_COMMUNITY): Payer: Medicare Other

## 2015-05-12 ENCOUNTER — Emergency Department (HOSPITAL_COMMUNITY)
Admission: EM | Admit: 2015-05-12 | Discharge: 2015-05-12 | Disposition: A | Discharge status: Home / Self Care | Payer: Medicare Other | Attending: Emergency Medicine | Admitting: Emergency Medicine

## 2015-05-12 ENCOUNTER — Encounter (HOSPITAL_COMMUNITY): Payer: Self-pay

## 2015-05-12 DIAGNOSIS — M810 Age-related osteoporosis without current pathological fracture: Secondary | ICD-10-CM | POA: Insufficient documentation

## 2015-05-12 DIAGNOSIS — K828 Other specified diseases of gallbladder: Secondary | ICD-10-CM | POA: Diagnosis not present

## 2015-05-12 DIAGNOSIS — Z7982 Long term (current) use of aspirin: Secondary | ICD-10-CM | POA: Diagnosis not present

## 2015-05-12 DIAGNOSIS — M545 Low back pain, unspecified: Secondary | ICD-10-CM

## 2015-05-12 DIAGNOSIS — I10 Essential (primary) hypertension: Secondary | ICD-10-CM | POA: Insufficient documentation

## 2015-05-12 DIAGNOSIS — Z8659 Personal history of other mental and behavioral disorders: Secondary | ICD-10-CM | POA: Insufficient documentation

## 2015-05-12 DIAGNOSIS — E7800 Pure hypercholesterolemia, unspecified: Secondary | ICD-10-CM | POA: Diagnosis not present

## 2015-05-12 DIAGNOSIS — H409 Unspecified glaucoma: Secondary | ICD-10-CM | POA: Insufficient documentation

## 2015-05-12 DIAGNOSIS — R6889 Other general symptoms and signs: Secondary | ICD-10-CM | POA: Diagnosis not present

## 2015-05-12 DIAGNOSIS — Z880 Allergy status to penicillin: Secondary | ICD-10-CM | POA: Diagnosis not present

## 2015-05-12 DIAGNOSIS — Z79899 Other long term (current) drug therapy: Secondary | ICD-10-CM | POA: Insufficient documentation

## 2015-05-12 LAB — URINALYSIS, ROUTINE W REFLEX MICROSCOPIC
BILIRUBIN URINE: NEGATIVE
GLUCOSE, UA: NEGATIVE mg/dL
Hgb urine dipstick: NEGATIVE
KETONES UR: 15 mg/dL — AB
Leukocytes, UA: NEGATIVE
NITRITE: NEGATIVE
PH: 7 (ref 5.0–8.0)
Protein, ur: NEGATIVE mg/dL
Specific Gravity, Urine: 1.024 (ref 1.005–1.030)
Urobilinogen, UA: 1 mg/dL (ref 0.0–1.0)

## 2015-05-12 LAB — CBC WITH DIFFERENTIAL/PLATELET
Basophils Absolute: 0 10*3/uL (ref 0.0–0.1)
Basophils Relative: 1 %
EOS ABS: 0.1 10*3/uL (ref 0.0–0.7)
Eosinophils Relative: 1 %
HEMATOCRIT: 38.9 % (ref 36.0–46.0)
HEMOGLOBIN: 12.9 g/dL (ref 12.0–15.0)
LYMPHS ABS: 2 10*3/uL (ref 0.7–4.0)
Lymphocytes Relative: 35 %
MCH: 32.9 pg (ref 26.0–34.0)
MCHC: 33.2 g/dL (ref 30.0–36.0)
MCV: 99.2 fL (ref 78.0–100.0)
MONO ABS: 0.4 10*3/uL (ref 0.1–1.0)
MONOS PCT: 8 %
NEUTROS ABS: 3.2 10*3/uL (ref 1.7–7.7)
NEUTROS PCT: 55 %
Platelets: 294 10*3/uL (ref 150–400)
RBC: 3.92 MIL/uL (ref 3.87–5.11)
RDW: 13.6 % (ref 11.5–15.5)
WBC: 5.7 10*3/uL (ref 4.0–10.5)

## 2015-05-12 LAB — COMPREHENSIVE METABOLIC PANEL
ALBUMIN: 2.8 g/dL — AB (ref 3.5–5.0)
ALK PHOS: 61 U/L (ref 38–126)
ALT: 14 U/L (ref 14–54)
ANION GAP: 6 (ref 5–15)
AST: 40 U/L (ref 15–41)
BILIRUBIN TOTAL: 0.7 mg/dL (ref 0.3–1.2)
BUN: 23 mg/dL — ABNORMAL HIGH (ref 6–20)
CALCIUM: 8.8 mg/dL — AB (ref 8.9–10.3)
CO2: 27 mmol/L (ref 22–32)
Chloride: 106 mmol/L (ref 101–111)
Creatinine, Ser: 0.73 mg/dL (ref 0.44–1.00)
GFR calc Af Amer: >60 mL/min (ref >60)
GLUCOSE: 101 mg/dL — AB (ref 65–99)
Potassium: 3.9 mmol/L (ref 3.5–5.1)
Sodium: 139 mmol/L (ref 135–145)
TOTAL PROTEIN: 5.3 g/dL — AB (ref 6.5–8.1)

## 2015-05-12 MED ORDER — IOHEXOL 300 MG/ML  SOLN
80.0000 mL | Freq: Once | INTRAMUSCULAR | Status: AC | PRN
  Administered 2015-05-12: 80 mL via INTRAVENOUS

## 2015-05-12 MED ORDER — IOHEXOL 300 MG/ML  SOLN
25.0000 mL | Freq: Once | INTRAMUSCULAR | Status: AC | PRN
  Administered 2015-05-12: 25 mL via ORAL

## 2015-05-12 MED ORDER — TRAMADOL HCL 50 MG PO TABS: ORAL_TABLET | ORAL | Status: AC

## 2015-05-12 MED ORDER — HYDROCODONE-ACETAMINOPHEN 5-325 MG PO TABS
1.0000 | ORAL_TABLET | Freq: Once | ORAL | Status: AC
  Administered 2015-05-12: 1 via ORAL
  Filled 2015-05-12: qty 1

## 2015-05-12 MED ORDER — SODIUM CHLORIDE 0.9 % IV BOLUS (SEPSIS)
500.0000 mL | Freq: Once | INTRAVENOUS | Status: AC
  Administered 2015-05-12: 500 mL via INTRAVENOUS

## 2015-05-12 NOTE — ED Notes (Signed)
She arrives from home via EMS with her husband.  She c/o low back pain R > L.  Per her husband, pt. Has not had a fever, nor any other sign of current illness.  Pt. Is aware of her situation and denies dysuria.  CMS intact all toes bilat. With palpable d.p. Pulses blilat.

## 2015-05-12 NOTE — Discharge Instructions (Signed)
Follow up with your md this week. °

## 2015-05-12 NOTE — ED Notes (Signed)
Bed: [facility]46 Expected date: 05/12/15 Expected time: 7:27 AM Means of arrival: Ambulance Comments: Elderly- EMS

## 2015-05-12 NOTE — ED Provider Notes (Signed)
CSN: [id number]     Arrival date & time 05/12/15  4696 History   First MD Initiated Contact with Patient 05/12/15 [phone]     Chief Complaint  Patient presents with  . Back Pain     (Consider location/radiation/quality/duration/timing/severity/associated sxs/prior Treatment) Patient is a 81 y.o. female presenting with back pain. The history is provided by the patient (Patient complains of lower back pain. This started today. No history of trauma).  Back Pain Location:  Lumbar spine Quality:  Aching Pain severity:  Mild Onset quality:  Sudden Timing:  Intermittent Chronicity:  New Context: not emotional stress   Associated symptoms: no abdominal pain, no chest pain and no headaches     Past Medical History  Diagnosis Date  . Hypertension   . Glaucoma   . Osteoporosis   . Chronic cough     evaluated by Dr. Baird Lyons  . Hypercholesterolemia     goal  LDL cholesterol less than 130  . SIADH (syndrome of inappropriate ADH production) (HCC)     sodium 124-128  . LBBB (left bundle branch block)   . Depression with somatization   . Allergic rhinitis   . Costochondritis   . GERD (gastroesophageal reflux disease)   . Memory loss     15/30 on 04-2013 probable Alzheimer's    Past Surgical History  Procedure Laterality Date  . Appendectomy    . Abdominal surgery      stomach wrap for reflux, surgery for "locked bowels"  . Cataract extraction    . US echocardiography  04-2009    Moderate aortic and mitral regurg  . Colonoscopy  01-2002   No family history on file. Social History  Substance Use Topics  . Smoking status: Never Smoker   . Smokeless tobacco: Never Used  . Alcohol Use: No   OB History    No data available     Review of Systems  Constitutional: Negative for appetite change and fatigue.  HENT: Negative for congestion, ear discharge and sinus pressure.   Eyes: Negative for discharge.  Respiratory: Negative for cough.   Cardiovascular: Negative for chest  pain.  Gastrointestinal: Negative for abdominal pain and diarrhea.  Genitourinary: Negative for frequency and hematuria.  Musculoskeletal: Positive for back pain.  Skin: Negative for rash.  Neurological: Negative for seizures and headaches.  Psychiatric/Behavioral: Negative for hallucinations.      Allergies  Amoxicillin  Home Medications   Prior to Admission medications   Medication Sig Start Date End Date Taking? Authorizing Provider  aspirin EC 81 MG tablet Take 81 mg by mouth every morning.   Yes Historical Provider, MD  bimatoprost (LUMIGAN) 0.01 % SOLN Place 1 drop into both eyes at bedtime.    Yes Historical Provider, MD  calcium-vitamin D (OSCAL WITH D) 500-200 MG-UNIT per tablet Take 1 tablet by mouth every morning.   Yes Historical Provider, MD  dextromethorphan (DELSYM) 30 MG/5ML liquid Take 30 mg by mouth 2 (two) times daily as needed for cough.   Yes Historical Provider, MD  dorzolamide-timolol (COSOPT) 22.3-6.8 MG/ML ophthalmic solution Place 1 drop into both eyes 2 (two) times daily.   Yes Historical Provider, MD  fish oil-omega-3 fatty acids 1000 MG capsule Take 1 g by mouth 2 (two) times daily.   Yes Historical Provider, MD  valsartan (DIOVAN) 160 MG tablet Take 80-160 mg by mouth daily as needed (for high blood pressure over 130).    Yes Historical Provider, MD  traMADol (ULTRAM) 50 MG tablet Take  one every 6 hours for pain not helped by tylenol or motrin 05/12/15   Milton Ferguson, MD   BP 139/51 mmHg  Pulse 60  Temp(Src) 97.4 F (36.3 C) (Oral)  Resp 18  SpO2 99% Physical Exam  Constitutional: She appears well-developed.  HENT:  Head: Normocephalic.  Eyes: Conjunctivae and EOM are normal. No scleral icterus.  Neck: Neck supple. No thyromegaly present.  Cardiovascular: Normal rate and regular rhythm.  Exam reveals no gallop and no friction rub.   No murmur heard. Pulmonary/Chest: No stridor. She has no wheezes. She has no rales. She exhibits no tenderness.   Abdominal: She exhibits no distension. There is no tenderness. There is no rebound.  Musculoskeletal: Normal range of motion. She exhibits no edema.  Tenderness along the lower lumbar spine  Lymphadenopathy:    She has no cervical adenopathy.  Neurological: She is alert. She exhibits normal muscle tone. Coordination normal.  Patient is oriented to person only. This is her normal  Skin: No rash noted. No erythema.  Psychiatric: She has a normal mood and affect. Her behavior is normal.    ED Course  Procedures (including critical care time) Labs Review Labs Reviewed  COMPREHENSIVE METABOLIC PANEL - Abnormal; Notable for the following:    Glucose, Bld 101 (*)    BUN 23 (*)    Calcium 8.8 (*)    Total Protein 5.3 (*)    Albumin 2.8 (*)    All other components within normal limits  URINALYSIS, ROUTINE W REFLEX MICROSCOPIC (NOT AT [facility]) - Abnormal; Notable for the following:    APPearance HAZY (*)    Ketones, ur 15 (*)    All other components within normal limits  CBC WITH DIFFERENTIAL/PLATELET    Imaging Review Dg Lumbar Spine Complete  05/12/2015  CLINICAL DATA:  Low back pain for 1 day. No known injury. History of osteoporosis. EXAM: LUMBAR SPINE - COMPLETE 4+ VIEW COMPARISON:  None. FINDINGS: There are 6 non rib-bearing lumbar type vertebral bodies. For the purposes of this dictation, lumbar levels will be labeled L1 through L6. There is a mild scoliotic curvature of the thoracolumbar spine with dominant caudal component convex to the left, potentially positional. No anterolisthesis or retrolisthesis. No definite pars defects. Lumbar vertebral body heights are preserved. Intervertebral disc space heights are preserved. There is partial ossification of multiple intervertebral disc spaces within the caudal aspect of the thoracic spine, most conspicuous at T10-T11, T11-T12 and T12-T1, though these disc space heights are preserved. Atherosclerotic plaque within the abdominal aorta.  Multiple metallic sutures are seen overlying the midline of the lower abdomen pelvis, likely the sequela of ventral abdominal hernia repair. Additional surgical clips overlie the expected location of the gastroesophageal junction. Visualized bowel gas pattern is normal. IMPRESSION: 1. Transitional anatomy with spinal labeling as above. 2. No definite explanation for patient's acute low back pain. Specifically, lumbar vertebral body heights are preserved. Electronically Signed   By: Sandi Mariscal M.D.   On: 05/12/2015 09:11   Ct Abdomen Pelvis W Contrast  05/12/2015  CLINICAL DATA:  Low back and abdominal pain since this morning. EXAM: CT ABDOMEN AND PELVIS WITH CONTRAST TECHNIQUE: Multidetector CT imaging of the abdomen and pelvis was performed using the standard protocol following bolus administration of intravenous contrast. CONTRAST:  38mL OMNIPAQUE IOHEXOL 300 MG/ML SOLN, 70mL OMNIPAQUE IOHEXOL 300 MG/ML SOLN COMPARISON:  None. FINDINGS: Lower chest: There is a moderate-sized left pleural effusion with overlying atelectasis. Small right effusion with minimal overlying atelectasis. The  heart is mildly enlarged. No pericardial effusion. Moderate atherosclerotic calcifications involving the distal descending thoracic aorta. Calcified granulomas are noted in the right lower lobe. Hepatobiliary: No focal hepatic lesions. Mild intrahepatic biliary dilatation, likely age related. The gallbladder is mildly distended. No common bile duct dilatation. Normal caliber and course of the main pancreatic duct. Pancreas: No mass, inflammation or duct dilatation. Age related atrophy. Spleen: Normal size.  No focal lesions. Adrenals/Urinary Tract: The adrenal glands are normal. Bilateral renal cysts. No worrisome renal lesions. No definite renal calculi. There is a small amount of contrast in the collecting systems. No obstructing ureteral calculi or bladder calculi. No bladder mass. Stomach/Bowel: There are surgical changes near  the GE junction likely from a prior Nissen fundoplication. The stomach and duodenum are otherwise unremarkable. The small bowel and colon do not demonstrate any acute inflammatory changes, mass lesions or obstructive findings. Scattered diverticulosis without findings for acute diverticulitis. Vascular/Lymphatic: No mesenteric or retroperitoneal mass or adenopathy. Advanced atherosclerotic calcifications involving the aorta and iliac arteries. The major venous structures are patent. Reproductive: The uterus demonstrates moderate endometrial thickening which is abnormal for age. It measures approximately 10 mm. Could not exclude endometrial carcinoma. Endometrial sampling may be indicated. The cervix appears somewhat prominent but no obvious mass. The ovaries are grossly normal for age. Other: No pelvic mass or adenopathy. No free pelvic fluid collections. No inguinal mass or adenopathy. No abdominal wall hernia or subcutaneous lesions. There are surgical changes involving the anterior abdominal wall possibly from a hernia repair. Musculoskeletal: No significant bony findings. Very little degenerative change in the spine for age. Osteoporosis. IMPRESSION: 1. Bilateral pleural effusions, left greater than right with overlying atelectasis. 2. Cardiac enlargement and coronary artery calcifications. 3. Advanced atherosclerotic calcifications involving the aorta and iliac arteries. 4. Mild gallbladder distention and mild intrahepatic biliary dilatation but no common bile duct dilatation. Recommend correlation with liver function studies. 5. No renal, ureteral or bladder calculi or mass. 6. The endometrium is abnormally thickened for age measuring 10 mm. Could not exclude endometrial cancer. Endometrial sampling may be indicated. Electronically Signed   By: Marijo Sanes M.D.   On: 05/12/2015 11:16   I have personally reviewed and evaluated these images and lab results as part of my medical decision-making.   EKG  Interpretation None      MDM   Final diagnoses:  Back pain at L4-L5 level    Labs unremarkable except for mild dehydration. Lumbar spine films unremarkable CT of the abdomen has no explanation for the back pain. Patient will be given Ultram and will follow-up her PCP this week    Milton Ferguson, MD 05/12/15 1215

## 2015-05-29 DIAGNOSIS — H02055 Trichiasis without entropian left lower eyelid: Secondary | ICD-10-CM | POA: Diagnosis not present

## 2015-05-29 DIAGNOSIS — H401131 Primary open-angle glaucoma, bilateral, mild stage: Secondary | ICD-10-CM | POA: Diagnosis not present

## 2015-08-30 DIAGNOSIS — H02052 Trichiasis without entropian right lower eyelid: Secondary | ICD-10-CM | POA: Diagnosis not present

## 2015-08-30 DIAGNOSIS — H02055 Trichiasis without entropian left lower eyelid: Secondary | ICD-10-CM | POA: Diagnosis not present

## 2015-09-18 ENCOUNTER — Emergency Department (HOSPITAL_COMMUNITY): Payer: Medicare Other

## 2015-09-18 ENCOUNTER — Encounter (HOSPITAL_COMMUNITY): Payer: Self-pay | Admitting: Emergency Medicine

## 2015-09-18 ENCOUNTER — Emergency Department (HOSPITAL_COMMUNITY)
Admission: EM | Admit: 2015-09-18 | Discharge: 2015-09-18 | Disposition: A | Discharge status: Home / Self Care | Payer: Medicare Other | Attending: Physician Assistant | Admitting: Physician Assistant

## 2015-09-18 DIAGNOSIS — S22080A Wedge compression fracture of T11-T12 vertebra, initial encounter for closed fracture: Secondary | ICD-10-CM | POA: Diagnosis not present

## 2015-09-18 DIAGNOSIS — M810 Age-related osteoporosis without current pathological fracture: Secondary | ICD-10-CM | POA: Insufficient documentation

## 2015-09-18 DIAGNOSIS — Y998 Other external cause status: Secondary | ICD-10-CM | POA: Insufficient documentation

## 2015-09-18 DIAGNOSIS — W19XXXA Unspecified fall, initial encounter: Secondary | ICD-10-CM

## 2015-09-18 DIAGNOSIS — T148 Other injury of unspecified body region: Secondary | ICD-10-CM | POA: Diagnosis not present

## 2015-09-18 DIAGNOSIS — F0390 Unspecified dementia without behavioral disturbance: Secondary | ICD-10-CM | POA: Insufficient documentation

## 2015-09-18 DIAGNOSIS — W1839XA Other fall on same level, initial encounter: Secondary | ICD-10-CM | POA: Diagnosis not present

## 2015-09-18 DIAGNOSIS — Y9289 Other specified places as the place of occurrence of the external cause: Secondary | ICD-10-CM | POA: Insufficient documentation

## 2015-09-18 DIAGNOSIS — I10 Essential (primary) hypertension: Secondary | ICD-10-CM | POA: Diagnosis not present

## 2015-09-18 DIAGNOSIS — M549 Dorsalgia, unspecified: Secondary | ICD-10-CM | POA: Diagnosis not present

## 2015-09-18 DIAGNOSIS — S0990XA Unspecified injury of head, initial encounter: Secondary | ICD-10-CM | POA: Diagnosis not present

## 2015-09-18 DIAGNOSIS — H409 Unspecified glaucoma: Secondary | ICD-10-CM | POA: Diagnosis not present

## 2015-09-18 DIAGNOSIS — Z880 Allergy status to penicillin: Secondary | ICD-10-CM | POA: Insufficient documentation

## 2015-09-18 DIAGNOSIS — R410 Disorientation, unspecified: Secondary | ICD-10-CM | POA: Insufficient documentation

## 2015-09-18 DIAGNOSIS — Z8719 Personal history of other diseases of the digestive system: Secondary | ICD-10-CM | POA: Diagnosis not present

## 2015-09-18 DIAGNOSIS — Z7982 Long term (current) use of aspirin: Secondary | ICD-10-CM | POA: Diagnosis not present

## 2015-09-18 DIAGNOSIS — Z8659 Personal history of other mental and behavioral disorders: Secondary | ICD-10-CM | POA: Diagnosis not present

## 2015-09-18 DIAGNOSIS — Z79899 Other long term (current) drug therapy: Secondary | ICD-10-CM | POA: Diagnosis not present

## 2015-09-18 DIAGNOSIS — Z8639 Personal history of other endocrine, nutritional and metabolic disease: Secondary | ICD-10-CM | POA: Diagnosis not present

## 2015-09-18 DIAGNOSIS — R079 Chest pain, unspecified: Secondary | ICD-10-CM | POA: Diagnosis not present

## 2015-09-18 DIAGNOSIS — S199XXA Unspecified injury of neck, initial encounter: Secondary | ICD-10-CM | POA: Diagnosis not present

## 2015-09-18 DIAGNOSIS — Y9389 Activity, other specified: Secondary | ICD-10-CM | POA: Diagnosis not present

## 2015-09-18 DIAGNOSIS — R102 Pelvic and perineal pain: Secondary | ICD-10-CM | POA: Diagnosis not present

## 2015-09-18 LAB — COMPREHENSIVE METABOLIC PANEL
ALT: 13 U/L — AB (ref 14–54)
AST: 36 U/L (ref 15–41)
Albumin: 3.2 g/dL — ABNORMAL LOW (ref 3.5–5.0)
Alkaline Phosphatase: 49 U/L (ref 38–126)
Anion gap: 8 (ref 5–15)
BUN: 22 mg/dL — AB (ref 6–20)
CHLORIDE: 107 mmol/L (ref 101–111)
CO2: 26 mmol/L (ref 22–32)
CREATININE: 0.73 mg/dL (ref 0.44–1.00)
Calcium: 9.1 mg/dL (ref 8.9–10.3)
GFR calc Af Amer: >60 mL/min (ref >60)
GFR calc non Af Amer: >60 mL/min (ref >60)
GLUCOSE: 96 mg/dL (ref 65–99)
Potassium: 4 mmol/L (ref 3.5–5.1)
SODIUM: 141 mmol/L (ref 135–145)
Total Bilirubin: 0.5 mg/dL (ref 0.3–1.2)
Total Protein: 5.8 g/dL — ABNORMAL LOW (ref 6.5–8.1)

## 2015-09-18 LAB — CBC WITH DIFFERENTIAL/PLATELET
Basophils Absolute: 0 10*3/uL (ref 0.0–0.1)
Basophils Relative: 0 %
EOS ABS: 0.1 10*3/uL (ref 0.0–0.7)
EOS PCT: 1 %
HCT: 42.2 % (ref 36.0–46.0)
HEMOGLOBIN: 13.7 g/dL (ref 12.0–15.0)
LYMPHS ABS: 1.8 10*3/uL (ref 0.7–4.0)
Lymphocytes Relative: 20 %
MCH: 33 pg (ref 26.0–34.0)
MCHC: 32.5 g/dL (ref 30.0–36.0)
MCV: 101.7 fL — ABNORMAL HIGH (ref 78.0–100.0)
MONO ABS: 0.7 10*3/uL (ref 0.1–1.0)
MONOS PCT: 8 %
Neutro Abs: 6.2 10*3/uL (ref 1.7–7.7)
Neutrophils Relative %: 71 %
PLATELETS: 265 10*3/uL (ref 150–400)
RBC: 4.15 MIL/uL (ref 3.87–5.11)
RDW: 13.9 % (ref 11.5–15.5)
WBC: 8.8 10*3/uL (ref 4.0–10.5)

## 2015-09-18 LAB — URINALYSIS, ROUTINE W REFLEX MICROSCOPIC
Bilirubin Urine: NEGATIVE
GLUCOSE, UA: NEGATIVE mg/dL
Hgb urine dipstick: NEGATIVE
Ketones, ur: NEGATIVE mg/dL
Nitrite: NEGATIVE
PROTEIN: NEGATIVE mg/dL
SPECIFIC GRAVITY, URINE: 1.021 (ref 1.005–1.030)
pH: 8 (ref 5.0–8.0)

## 2015-09-18 LAB — I-STAT TROPONIN, ED: TROPONIN I, POC: 0.01 ng/mL (ref 0.00–0.08)

## 2015-09-18 LAB — URINE MICROSCOPIC-ADD ON

## 2015-09-18 NOTE — ED Notes (Signed)
Pt ambulated well with minimal staff assistance

## 2015-09-18 NOTE — ED Notes (Signed)
Patient transported to X-ray 

## 2015-09-18 NOTE — ED Notes (Signed)
Bed: [id number] Expected date:  Expected time:  Means of arrival:  Comments: EMS-elderly fall

## 2015-09-18 NOTE — ED Notes (Signed)
From home via [facility], fall with low back pain, no hip pain, no head trauma or LOC, VSS, CBG 96

## 2015-09-18 NOTE — ED Provider Notes (Signed)
CSN: [id number]     Arrival date & time 09/18/15  [id number] History   First MD Initiated Contact with Patient 09/18/15 [phone]     Chief Complaint  Patient presents with  . Fall     (Consider location/radiation/quality/duration/timing/severity/associated sxs/prior Treatment) HPI   Patient is a 82 year old female with history of dementia presenting today after unwitnessed fall. Patient has been walked out of the room and patient was found down after trying to get out of bed. Patient has history of osteoporosis, SIADH, depression with somatization.  Patient's husband brought her here for evaluation.  Level V caveat dementia.  Past Medical History  Diagnosis Date  . Hypertension   . Glaucoma   . Osteoporosis   . Chronic cough     evaluated by Dr. Baird Lyons  . Hypercholesterolemia     goal  LDL cholesterol less than 130  . SIADH (syndrome of inappropriate ADH production) ([facility])     sodium 124-128  . LBBB (left bundle branch block)   . Depression with somatization   . Allergic rhinitis   . Costochondritis   . GERD (gastroesophageal reflux disease)   . Memory loss     15/30 on 04-2013 probable Alzheimer's    Past Surgical History  Procedure Laterality Date  . Appendectomy    . Abdominal surgery      stomach wrap for reflux, surgery for "locked bowels"  . Cataract extraction    . US echocardiography  04-2009    Moderate aortic and mitral regurg  . Colonoscopy  01-2002   No family history on file. Social History  Substance Use Topics  . Smoking status: Never Smoker   . Smokeless tobacco: Never Used  . Alcohol Use: No   OB History    No data available     Review of Systems  Constitutional: Negative for fatigue.  Respiratory: Negative for cough.   Genitourinary: Negative for dysuria and urgency.  Psychiatric/Behavioral: Positive for confusion.  All other systems reviewed and are negative.     Allergies  Amoxicillin  Home Medications   Prior to Admission  medications   Medication Sig Start Date End Date Taking? Authorizing Provider  acetaminophen (TYLENOL) 500 MG tablet Take 500 mg by mouth every 6 (six) hours as needed for mild pain.   Yes Historical Provider, MD  aspirin EC 81 MG tablet Take 81 mg by mouth every morning.   Yes Historical Provider, MD  bimatoprost (LUMIGAN) 0.01 % SOLN Place 1 drop into both eyes at bedtime.    Yes Historical Provider, MD  calcium-vitamin D (OSCAL WITH D) 500-200 MG-UNIT per tablet Take 1 tablet by mouth every morning.   Yes Historical Provider, MD  dorzolamide-timolol (COSOPT) 22.3-6.8 MG/ML ophthalmic solution Place 1 drop into both eyes 2 (two) times daily.   Yes Historical Provider, MD  guaiFENesin-dextromethorphan (ROBITUSSIN DM) 100-10 MG/5ML syrup Take 5 mLs by mouth every 4 (four) hours as needed for cough.   Yes Historical Provider, MD  Multiple Vitamin (MULTIVITAMIN WITH MINERALS) TABS tablet Take 1 tablet by mouth daily.   Yes Historical Provider, MD  valsartan (DIOVAN) 160 MG tablet Take 80-160 mg by mouth daily as needed (for high blood pressure over 130).    Yes Historical Provider, MD  traMADol (ULTRAM) 50 MG tablet Take one every 6 hours for pain not helped by tylenol or motrin Patient not taking: Reported on 09/18/2015 05/12/15   Milton Ferguson, MD   BP 169/54 mmHg  Pulse 58  Temp(Src) 98 F (  36.7 C) (Oral)  Resp 20  SpO2 96% Physical Exam  Constitutional: She appears well-developed and well-nourished.  Frail elderly female.  HENT:  Head: Normocephalic and atraumatic.  Eyes: Conjunctivae are normal. Right eye exhibits no discharge.  Neck: Neck supple.  Cardiovascular: Normal rate, regular rhythm and normal heart sounds.   No murmur heard. Pulmonary/Chest: Effort normal and breath sounds normal. She has no wheezes. She has no rales.  Abdominal: Soft. She exhibits no distension. There is no tenderness.  Musculoskeletal: Normal range of motion. She exhibits no edema.  Patient has tenderness  everywhere. No focality.  Neurological: No cranial nerve deficit.  Mild confusion at baseline.  Skin: Skin is warm and dry. No rash noted. She is not diaphoretic.  No external evidence of trauma, no abrasions, no ecchymosis.  Psychiatric: She has a normal mood and affect. Her behavior is normal.  Nursing note and vitals reviewed.   ED Course  Procedures (including critical care time) Labs Review Labs Reviewed  URINALYSIS, ROUTINE W REFLEX MICROSCOPIC (NOT AT [facility]) - Abnormal; Notable for the following:    APPearance TURBID (*)    Leukocytes, UA SMALL (*)    All other components within normal limits  COMPREHENSIVE METABOLIC PANEL - Abnormal; Notable for the following:    BUN 22 (*)    Total Protein 5.8 (*)    Albumin 3.2 (*)    ALT 13 (*)    All other components within normal limits  CBC WITH DIFFERENTIAL/PLATELET - Abnormal; Notable for the following:    MCV 101.7 (*)    All other components within normal limits  URINE MICROSCOPIC-ADD ON - Abnormal; Notable for the following:    Squamous Epithelial / LPF 0-5 (*)    Bacteria, UA MANY (*)    All other components within normal limits  I-STAT TROPOININ, ED    Imaging Review Dg Chest 2 View  09/18/2015  CLINICAL DATA:  Pain following fall. EXAM: CHEST  2 VIEW COMPARISON:  Chest radiograph and chest CT March 19, 2015 FINDINGS: There is a stable persistent sizable pleural effusion on the left with left lower lobe and lingular atelectatic change. Lungs elsewhere clear. Heart is upper normal in size with pulmonary vascularity within normal limits. There is atherosclerotic calcification in the aorta. No adenopathy evident. No pneumothorax. Bones are osteoporotic. There is anterior wedging of the T12 vertebral body, not present prior. IMPRESSION: Sizable pleural effusion on the left with atelectatic change on the left, stable from prior studies. Lungs elsewhere clear. No pneumothorax. Age uncertain anterior wedging of the T12 vertebral  body, not present on most recent available prior studies. Electronically Signed   By: Lowella Grip III M.D.   On: 09/18/2015 09:19   Dg Pelvis 1-2 Views  09/18/2015  CLINICAL DATA:  Status post fall this morning. Pain. Initial encounter. EXAM: PELVIS - 1-2 VIEW COMPARISON:  CT abdomen and pelvis 05/14/2015. FINDINGS: No acute bony or joint abnormality is identified. Calcification off the right greater trochanter is unchanged. Suture material is noted in the abdomen. Atherosclerotic vascular disease is seen. IMPRESSION: No acute abnormality. Electronically Signed   By: Inge Rise M.D.   On: 09/18/2015 09:19   Ct Head Wo Contrast  09/18/2015  CLINICAL DATA:  82 year old female with a history of dementia and fall. EXAM: CT HEAD WITHOUT CONTRAST CT CERVICAL SPINE WITHOUT CONTRAST TECHNIQUE: Multidetector CT imaging of the head and cervical spine was performed following the standard protocol without intravenous contrast. Multiplanar CT image reconstructions of the  cervical spine were also generated. COMPARISON:  CT head FINDINGS: CT HEAD FINDINGS Unremarkable appearance of the calvarium without acute fracture or aggressive lesion. Unremarkable appearance of the scalp soft tissues. Unremarkable appearance of the bilateral orbits. Mastoid air cells are clear. Visualized paranasal sinuses clear. Partially visualized impacted left maxillary tooth. No acute intracranial hemorrhage. No midline shift. No mass effect. Diffuse brain volume loss with expansion of the ventricles, not out of proportion to the degree of sulcal effacement. Gray-white differentiation maintained. Calcifications of the intracranial vasculature. CT CERVICAL SPINE FINDINGS Craniocervical junction aligned. No acute fracture at the skullbase identified. Anatomic alignment of the cervical elements is relatively maintained. No subluxation. Accentuated lordotic curvature of the cervical spine. Diffuse osteopenia.  No displaced fracture.  Vertebral body heights relatively maintained. Alignment of the facets maintained. Mild bilateral facet disease, contributing to varying degrees of bilateral neural foraminal narrowing of the cervical spine. Posterior central disc herniation at C4-C5, indeterminate age. Herniation does appear to contact the anterior thecal sac. No evidence of epidural hemorrhage. Unremarkable appearance of the cervical soft tissues. Pleural parenchymal scarring at the lung apices IMPRESSION: Head CT: No CT evidence of acute intracranial abnormality. Senescent brain volume loss and chronic microvascular ischemic disease with associated intracranial atherosclerosis. Cervical spine CT: No CT evidence of acute fracture or malalignment of the cervical spine. Posterior central disc herniation at C4-C5, indeterminate acuity. This disc herniation does appear to contact the anterior thecal sac. Signed, Dulcy Fanny. Earleen Newport, DO [facility] Electronically Signed   By: Corrie Mckusick D.O.   On: 09/18/2015 10:26   Ct Cervical Spine Wo Contrast  09/18/2015  CLINICAL DATA:  82 year old female with a history of dementia and fall. EXAM: CT HEAD WITHOUT CONTRAST CT CERVICAL SPINE WITHOUT CONTRAST TECHNIQUE: Multidetector CT imaging of the head and cervical spine was performed following the standard protocol without intravenous contrast. Multiplanar CT image reconstructions of the cervical spine were also generated. COMPARISON:  CT head FINDINGS: CT HEAD FINDINGS Unremarkable appearance of the calvarium without acute fracture or aggressive lesion. Unremarkable appearance of the scalp soft tissues. Unremarkable appearance of the bilateral orbits. Mastoid air cells are clear. Visualized paranasal sinuses clear. Partially visualized impacted left maxillary tooth. No acute intracranial hemorrhage. No midline shift. No mass effect. Diffuse brain volume loss with expansion of the ventricles, not out of  proportion to the degree of sulcal effacement. Gray-white differentiation maintained. Calcifications of the intracranial vasculature. CT CERVICAL SPINE FINDINGS Craniocervical junction aligned. No acute fracture at the skullbase identified. Anatomic alignment of the cervical elements is relatively maintained. No subluxation. Accentuated lordotic curvature of the cervical spine. Diffuse osteopenia.  No displaced fracture. Vertebral body heights relatively maintained. Alignment of the facets maintained. Mild bilateral facet disease, contributing to varying degrees of bilateral neural foraminal narrowing of the cervical spine. Posterior central disc herniation at C4-C5, indeterminate age. Herniation does appear to contact the anterior thecal sac. No evidence of epidural hemorrhage. Unremarkable appearance of the cervical soft tissues. Pleural parenchymal scarring at the lung apices IMPRESSION: Head CT: No CT evidence of acute intracranial abnormality. Senescent brain volume loss and chronic microvascular ischemic disease with associated intracranial atherosclerosis. Cervical spine CT: No CT evidence of acute fracture or malalignment of the cervical spine. Posterior central disc herniation at C4-C5, indeterminate acuity. This disc herniation does appear to contact the anterior thecal sac. Signed, Dulcy Fanny. Earleen Newport, DO [facility] Electronically Signed   By: Corrie Mckusick D.O.  On: 09/18/2015 10:26   Ct Thoracic Spine Wo Contrast  09/18/2015  CLINICAL DATA:  82 year old female with a history of fall and dementia. Chest x-ray suggests new compression fracture of T12. EXAM: CT THORACIC SPINE WITHOUT CONTRAST TECHNIQUE: Multidetector CT imaging of the thoracic spine was performed without intravenous contrast administration. Multiplanar CT image reconstructions were also generated. COMPARISON:  Chest x-ray 09/18/2015, CT chest 03/19/2015 FINDINGS: Kyphotic  curvature of the thoracic spine.  No subluxation. Facets maintain alignment. New compression fracture of the T12 level compared to the chest CT of September 2016. Less than 30% anterior height loss. Minimal retropulsion of the posterior endplate without significant canal narrowing. Sclerotic changes along the superior endplate. Multilevel degenerative disc disease with anterior osteophyte production throughout the thoracic spine. Left pleural effusion with associated atelectasis. Native coronary vascular calcifications. Calcifications of the thoracic aorta. IMPRESSION: New compression fracture of T12 from September 2016, with less than 30% anterior height loss and no compromise of the canal. Given the sclerotic appearance, this is favored to be chronic, however, any subacute/unhealed component could only be assessed with MRI. If the patient has point tenderness at this location or ongoing symptoms, referral for evaluation may be considered. Left pleural effusion and atelectasis. Signed, Dulcy Fanny. Earleen Newport, DO [facility] Electronically Signed   By: Corrie Mckusick D.O.   On: 09/18/2015 11:41   I have personally reviewed and evaluated these images and lab results as part of my medical decision-making.   EKG Interpretation   Date/Time:  Wednesday September 18 2015 08:26:29 EDT Ventricular Rate:  53 PR Interval:  203 QRS Duration: 144 QT Interval:  479 QTC Calculation: 450 R Axis:   18 Text Interpretation:  Sinus rhythm Left bundle branch block Left bundle  branch block No significant change since last tracing Confirmed by  Gerald Leitz ([id number]) on 09/18/2015 8:38:16 AM      MDM   Final diagnoses:  Fall    Patient is a 82 year old female with history of dementia, depression with somatization, presenting after unwitnessed fall. Patient has no external signs of trauma. Will get basic imaging including CT head and neck given age. Will get chest  x-ray and pelvis. Patient has tenderness to palpation almost everywhere with tenderness with blood pressure measurements. Patient has no external evidence of trauma, ecchymosis.  We will get baseline labs, urine, EKG. Imaging comes back normal plan to by mouth trial and ambulation trial.  3:33 PM X-ray should T spine fracture unknown age. We'll get CT to better evaluate.  CT looks like likely old fracture. No point tenderness. She walks very well without pain. Told husband to follow up with ortho as needed.   Frederico Gerling Julio Alm, MD 09/18/15 [phone]

## 2015-09-18 NOTE — Discharge Instructions (Signed)
You have a fracture in her back. We think this is old. Please use Tylenol help with pain. Please follow-up with her primary care physician and orthopedic physician as needed. Please return with any concerns or symptoms.

## 2015-10-07 DIAGNOSIS — R269 Unspecified abnormalities of gait and mobility: Secondary | ICD-10-CM | POA: Diagnosis not present

## 2015-10-07 DIAGNOSIS — F0280 Dementia in other diseases classified elsewhere without behavioral disturbance: Secondary | ICD-10-CM | POA: Diagnosis not present

## 2015-10-07 DIAGNOSIS — M545 Low back pain: Secondary | ICD-10-CM | POA: Diagnosis not present

## 2015-10-07 DIAGNOSIS — Z79899 Other long term (current) drug therapy: Secondary | ICD-10-CM | POA: Diagnosis not present

## 2015-10-07 DIAGNOSIS — G301 Alzheimer's disease with late onset: Secondary | ICD-10-CM | POA: Diagnosis not present

## 2015-10-07 DIAGNOSIS — E46 Unspecified protein-calorie malnutrition: Secondary | ICD-10-CM | POA: Diagnosis not present

## 2016-01-10 DIAGNOSIS — H02052 Trichiasis without entropian right lower eyelid: Secondary | ICD-10-CM | POA: Diagnosis not present

## 2016-01-10 DIAGNOSIS — H02055 Trichiasis without entropian left lower eyelid: Secondary | ICD-10-CM | POA: Diagnosis not present

## 2016-01-10 DIAGNOSIS — H02201 Unspecified lagophthalmos right upper eyelid: Secondary | ICD-10-CM | POA: Diagnosis not present

## 2016-01-10 DIAGNOSIS — H401131 Primary open-angle glaucoma, bilateral, mild stage: Secondary | ICD-10-CM | POA: Diagnosis not present

## 2016-01-10 DIAGNOSIS — H04123 Dry eye syndrome of bilateral lacrimal glands: Secondary | ICD-10-CM | POA: Diagnosis not present

## 2016-01-17 DIAGNOSIS — H02055 Trichiasis without entropian left lower eyelid: Secondary | ICD-10-CM | POA: Diagnosis not present

## 2016-01-17 DIAGNOSIS — H02201 Unspecified lagophthalmos right upper eyelid: Secondary | ICD-10-CM | POA: Diagnosis not present

## 2016-01-17 DIAGNOSIS — H02202 Unspecified lagophthalmos right lower eyelid: Secondary | ICD-10-CM | POA: Diagnosis not present

## 2016-01-17 DIAGNOSIS — H04123 Dry eye syndrome of bilateral lacrimal glands: Secondary | ICD-10-CM | POA: Diagnosis not present

## 2016-01-21 DIAGNOSIS — H9313 Tinnitus, bilateral: Secondary | ICD-10-CM | POA: Diagnosis not present

## 2016-01-21 DIAGNOSIS — H903 Sensorineural hearing loss, bilateral: Secondary | ICD-10-CM | POA: Diagnosis not present

## 2016-01-21 DIAGNOSIS — H6123 Impacted cerumen, bilateral: Secondary | ICD-10-CM | POA: Diagnosis not present

## 2016-02-16 ENCOUNTER — Emergency Department (HOSPITAL_COMMUNITY): Payer: Medicare Other

## 2016-02-16 ENCOUNTER — Emergency Department (HOSPITAL_COMMUNITY)
Admission: EM | Admit: 2016-02-16 | Discharge: 2016-02-16 | Disposition: A | Discharge status: Home / Self Care | Payer: Medicare Other | Attending: Emergency Medicine | Admitting: Emergency Medicine

## 2016-02-16 ENCOUNTER — Encounter (HOSPITAL_COMMUNITY): Payer: Self-pay | Admitting: *Deleted

## 2016-02-16 DIAGNOSIS — R4182 Altered mental status, unspecified: Secondary | ICD-10-CM | POA: Diagnosis not present

## 2016-02-16 DIAGNOSIS — R531 Weakness: Secondary | ICD-10-CM | POA: Diagnosis not present

## 2016-02-16 DIAGNOSIS — Z79899 Other long term (current) drug therapy: Secondary | ICD-10-CM | POA: Insufficient documentation

## 2016-02-16 DIAGNOSIS — Z7982 Long term (current) use of aspirin: Secondary | ICD-10-CM | POA: Diagnosis not present

## 2016-02-16 DIAGNOSIS — J90 Pleural effusion, not elsewhere classified: Secondary | ICD-10-CM | POA: Diagnosis not present

## 2016-02-16 DIAGNOSIS — I10 Essential (primary) hypertension: Secondary | ICD-10-CM | POA: Diagnosis not present

## 2016-02-16 DIAGNOSIS — Z0000 Encounter for general adult medical examination without abnormal findings: Secondary | ICD-10-CM | POA: Diagnosis not present

## 2016-02-16 LAB — URINALYSIS, ROUTINE W REFLEX MICROSCOPIC
BILIRUBIN URINE: NEGATIVE
GLUCOSE, UA: NEGATIVE mg/dL
Hgb urine dipstick: NEGATIVE
KETONES UR: NEGATIVE mg/dL
Leukocytes, UA: NEGATIVE
Nitrite: NEGATIVE
PH: 8.5 — AB (ref 5.0–8.0)
Protein, ur: NEGATIVE mg/dL
Specific Gravity, Urine: 1.019 (ref 1.005–1.030)

## 2016-02-16 LAB — COMPREHENSIVE METABOLIC PANEL
ALBUMIN: 3.2 g/dL — AB (ref 3.5–5.0)
ALK PHOS: 45 U/L (ref 38–126)
ALT: 12 U/L — ABNORMAL LOW (ref 14–54)
ANION GAP: 6 (ref 5–15)
AST: 32 U/L (ref 15–41)
BUN: 17 mg/dL (ref 6–20)
CALCIUM: 8.9 mg/dL (ref 8.9–10.3)
CO2: 27 mmol/L (ref 22–32)
Chloride: 107 mmol/L (ref 101–111)
Creatinine, Ser: 0.69 mg/dL (ref 0.44–1.00)
GFR calc Af Amer: >60 mL/min (ref >60)
GFR calc non Af Amer: >60 mL/min (ref >60)
GLUCOSE: 100 mg/dL — AB (ref 65–99)
POTASSIUM: 4.2 mmol/L (ref 3.5–5.1)
SODIUM: 140 mmol/L (ref 135–145)
Total Bilirubin: 0.8 mg/dL (ref 0.3–1.2)
Total Protein: 5.9 g/dL — ABNORMAL LOW (ref 6.5–8.1)

## 2016-02-16 LAB — CBC WITH DIFFERENTIAL/PLATELET
Basophils Absolute: 0 10*3/uL (ref 0.0–0.1)
Basophils Relative: 1 %
Eosinophils Absolute: 0.1 10*3/uL (ref 0.0–0.7)
Eosinophils Relative: 1 %
HEMATOCRIT: 38.8 % (ref 36.0–46.0)
Hemoglobin: 12.9 g/dL (ref 12.0–15.0)
LYMPHS PCT: 39 %
Lymphs Abs: 2.5 10*3/uL (ref 0.7–4.0)
MCH: 32.7 pg (ref 26.0–34.0)
MCHC: 33.2 g/dL (ref 30.0–36.0)
MCV: 98.2 fL (ref 78.0–100.0)
MONO ABS: 0.7 10*3/uL (ref 0.1–1.0)
MONOS PCT: 11 %
NEUTROS ABS: 3.1 10*3/uL (ref 1.7–7.7)
Neutrophils Relative %: 48 %
Platelets: 286 10*3/uL (ref 150–400)
RBC: 3.95 MIL/uL (ref 3.87–5.11)
RDW: 13.4 % (ref 11.5–15.5)
WBC: 6.5 10*3/uL (ref 4.0–10.5)

## 2016-02-16 LAB — I-STAT TROPONIN, ED: Troponin i, poc: 0 ng/mL (ref 0.00–0.08)

## 2016-02-16 MED ORDER — LEVOFLOXACIN 750 MG PO TABS
750.0000 mg | ORAL_TABLET | Freq: Once | ORAL | Status: AC
  Administered 2016-02-16: 750 mg via ORAL
  Filled 2016-02-16: qty 1

## 2016-02-16 MED ORDER — LEVOFLOXACIN 500 MG PO TABS
500.0000 mg | ORAL_TABLET | Freq: Every day | ORAL | 0 refills | Status: AC
Start: 2016-02-17 — End: 2016-02-21

## 2016-02-16 NOTE — ED Provider Notes (Signed)
Emergency Department Provider Note   I have reviewed the triage vital signs and the nursing notes.   HISTORY  Chief Complaint Altered Mental Status   HPI Natasha David is a 82 y.o. female with PMH of dementia, GERD, HTN, and depression presents to the emergency department for evaluation of altered mental status this morning and decreased alertness around lunchtime. The patient's husband states that she is become progressively more confused especially at night. She woke up this morning and did not realize she was at her home and wanted to be taken to her home. The husband put her in the car backed out of the driveway of the patient realize she was at home. They went inside and the patient laid down. Around lunchtime the husband brought her lunch and found her to not be responsive. Her eyes are closed she appeared to be breathing. No obvious seizure activity. When he was unable to wake her after trying multiple times he called EMS. He states that they were able to wake her and obtain vital signs which were normal. At that time she was transported to the emergency department for further evaluation.  The patient has dementia which limits HPI and ROS.    Past Medical History:  Diagnosis Date  . Allergic rhinitis   . Chronic cough    evaluated by Dr. Baird Lyons  . Costochondritis   . Depression with somatization   . GERD (gastroesophageal reflux disease)   . Glaucoma   . Hypercholesterolemia    goal  LDL cholesterol less than 130  . Hypertension   . LBBB (left bundle branch block)   . Memory loss    15/30 on 04-2013 probable Alzheimer's   . Osteoporosis   . SIADH (syndrome of inappropriate ADH production) ([place])    sodium 124-128    Patient Active Problem List   Diagnosis Date Noted  . Vertigo 10/18/2013  . Hyponatremia 03/05/2013  . Dizziness and giddiness 03/05/2013  . Accelerated hypertension 03/05/2013  . Glaucoma     Past Surgical History:  Procedure Laterality  Date  . ABDOMINAL SURGERY     stomach wrap for reflux, surgery for "locked bowels"  . APPENDECTOMY    . CATARACT EXTRACTION    . COLONOSCOPY  01-2002  . US ECHOCARDIOGRAPHY  04-2009   Moderate aortic and mitral regurg    Current Outpatient Rx  . Order #: [id number] Class: Historical Med  . Order #: [id number] Class: Historical Med  . Order #: [id number] Class: Historical Med  . Order #: [id number] Class: Historical Med  . Order #: [id number] Class: Historical Med  . Order #: [id number] Class: Historical Med  . [START ON 02/17/2016] Order #: [id number] Class: Print  . Order #: [id number] Class: Print    Allergies Amoxicillin  No family history on file.  Social History Social History  Substance Use Topics  . Smoking status: Never Smoker  . Smokeless tobacco: Never Used  . Alcohol use No    Review of Systems  Limited by patient's underlying dementia.   ____________________________________________   PHYSICAL EXAM:  VITAL SIGNS: ED Triage Vitals  Enc Vitals Group     BP 02/16/16 1230 (!) 176/52     Pulse Rate 02/16/16 1230 61     Resp 02/16/16 1230 16     Temp 02/16/16 1230 98 F (36.7 C)     Temp Source 02/16/16 1230 Oral     SpO2 02/16/16 1229 98 %   Constitutional: Alert and oriented. Well appearing and in no acute distress. Eyes:  Conjunctivae are normal. PERRL. EOMI. Head: Atraumatic. Nose: No congestion/rhinnorhea. Mouth/Throat: Mucous membranes are dry. Oropharynx non-erythematous. Neck: No stridor.   Cardiovascular: Normal rate, regular rhythm. Good peripheral circulation. Grossly normal heart sounds.   Respiratory: Normal respiratory effort.  No retractions. Lungs CTAB. Gastrointestinal: Soft and nontender. No distention.  Musculoskeletal: No lower extremity tenderness nor edema. No gross deformities of extremities. Neurologic:  Normal speech and language. No gross focal neurologic deficits are appreciated.  Skin:  Skin is warm, dry and intact. No rash  noted. Psychiatric: Mood and affect are normal. Speech and behavior are confused but without delirium.   ____________________________________________   LABS (all labs ordered are listed, but only abnormal results are displayed)  Labs Reviewed  COMPREHENSIVE METABOLIC PANEL - Abnormal; Notable for the following:       Result Value   Glucose, Bld 100 (*)    Total Protein 5.9 (*)    Albumin 3.2 (*)    ALT 12 (*)    All other components within normal limits  URINALYSIS, ROUTINE W REFLEX MICROSCOPIC (NOT AT [facility]) - Abnormal; Notable for the following:    pH 8.5 (*)    All other components within normal limits  CBC WITH DIFFERENTIAL/PLATELET  I-STAT TROPOININ, ED   ____________________________________________  RADIOLOGY  Dg Chest 2 View  Result Date: 02/16/2016 CLINICAL DATA:  Dyspnea EXAM: CHEST  2 VIEW COMPARISON:  09/18/2015 chest radiograph. FINDINGS: Stable cardiomediastinal silhouette with top-normal heart size and aortic atherosclerosis. No pneumothorax. Moderate left pleural effusion. No right pleural effusion. No pulmonary edema. Patchy left lung base opacity. Surgical clips are seen in the medial left upper abdomen. IMPRESSION: 1. Patchy left lung base opacity, which could represent aspiration, pneumonia and/or atelectasis. Underlying mass not excluded. Recommend follow-up PA and lateral post treatment chest radiographs in 4-6 weeks. 2. Moderate left pleural effusion. 3. Aortic atherosclerosis . Electronically Signed   By: Ilona Sorrel M.D.   On: 02/16/2016 14:10   ____________________________________________   PROCEDURES  Procedure(s) performed:   Procedures  None ____________________________________________   INITIAL IMPRESSION / ASSESSMENT AND PLAN / ED COURSE  Pertinent labs & imaging results that were available during my care of the patient were reviewed by me and considered in my medical decision making (see chart for details).  Patient presents to the  emergency department for evaluation of altered mental status. The patient has a history of Alzheimer's dementia and has been reports that much of the confusion has been gradually worsening. She has not seen her primary care physician. Patient did have a period of decreased responsiveness today at lunch time with no obvious seizure activity. Patient was normal by the time EMS arrived. Patient is at her mental status baseline currently with no evidence of acute delirium. No suspicion for TIA. We'll evaluate for underlying infection symptoms, which abnormalities, dehydration.   04:44 PM Reviewed the chest x-ray findings carefully. The patient has no signs or symptoms to suggest aspiration pneumonia. The husband is insistent that she's had no fevers, cough, or choking episodes. The patient has no leukocytosis here in the emergency department. Her oxygen saturation is normal on room air with normal work of breathing. I had a long discussion with family and offered admission but given her lack of symptoms and return to baseline they would prefer to return home with PO antibiotics and PCP follow up in 24-48 hours. Given the equivocal CXR and lack of signs of PNA this seems reasonable. Discussed return precautions in detail. Will discharge home at  this time.  ____________________________________________  FINAL CLINICAL IMPRESSION(S) / ED DIAGNOSES  Final diagnoses:  Altered mental status, unspecified altered mental status type     MEDICATIONS GIVEN DURING THIS VISIT:  Medications  levofloxacin (LEVAQUIN) tablet 750 mg (750 mg Oral Given 02/16/16 1717)     NEW OUTPATIENT MEDICATIONS STARTED DURING THIS VISIT:  Discharge Medication List as of 02/16/2016  5:05 PM    START taking these medications   Details  levofloxacin (LEVAQUIN) 500 MG tablet Take 1 tablet (500 mg total) by mouth daily., Starting Mon 02/17/2016, Until Fri 02/21/2016, Print          Note:  This document was prepared using Dragon  voice recognition software and may include unintentional dictation errors.  Nanda Quinton, MD Emergency Medicine   Margette Fast, MD 02/16/16 [phone]

## 2016-02-16 NOTE — ED Notes (Signed)
Bed: [id number] Expected date:  Expected time:  Means of arrival:  Comments: 82 yo Evaluation

## 2016-02-16 NOTE — Discharge Instructions (Signed)
You were seen in the ED today with confusion and difficulty waking this afternoon. Your lab show no evidence of infection. Your chest x-ray showed an area that could be infection or mass. We are treating you with antibiotics for possible pneumonia.   Return to the ED with any difficulty breathing, fever, chills, cough, or any other periods where it is difficult to wake.

## 2016-02-16 NOTE — ED Triage Notes (Signed)
Per EMS pt from home, husband reports pt woke up this morning confused, was asking husband to take her back home, husband helped her get dressed and drove her to the end of the driveway at which point patient realized she was home. Husband then went to kitchen to make lunch, and pt fell asleep. Husband attempted to wake pt up, but was unable to do so, and 911 was called. First responders arrived and applied some sternal rub and pt awoke. Per EMS husband state pt is at her baseline at this time (hx of mild dementia). Husband sts pt hasn't seen her dr in over a year (pt refused).

## 2016-02-19 ENCOUNTER — Other Ambulatory Visit: Payer: Self-pay | Admitting: Geriatric Medicine

## 2016-02-19 ENCOUNTER — Ambulatory Visit
Admission: RE | Admit: 2016-02-19 | Discharge: 2016-02-19 | Disposition: A | Discharge status: Home / Self Care | Payer: Medicare Other | Source: Ambulatory Visit | Attending: Geriatric Medicine | Admitting: Geriatric Medicine

## 2016-02-19 DIAGNOSIS — J181 Lobar pneumonia, unspecified organism: Secondary | ICD-10-CM | POA: Diagnosis not present

## 2016-02-19 DIAGNOSIS — G301 Alzheimer's disease with late onset: Secondary | ICD-10-CM | POA: Diagnosis not present

## 2016-02-19 DIAGNOSIS — F0280 Dementia in other diseases classified elsewhere without behavioral disturbance: Secondary | ICD-10-CM | POA: Diagnosis not present

## 2016-02-19 DIAGNOSIS — L989 Disorder of the skin and subcutaneous tissue, unspecified: Secondary | ICD-10-CM | POA: Diagnosis not present

## 2016-02-19 DIAGNOSIS — F5101 Primary insomnia: Secondary | ICD-10-CM | POA: Diagnosis not present

## 2016-02-19 DIAGNOSIS — J189 Pneumonia, unspecified organism: Secondary | ICD-10-CM | POA: Diagnosis not present

## 2016-02-25 DIAGNOSIS — D485 Neoplasm of uncertain behavior of skin: Secondary | ICD-10-CM | POA: Diagnosis not present

## 2016-02-25 DIAGNOSIS — D0439 Carcinoma in situ of skin of other parts of face: Secondary | ICD-10-CM | POA: Diagnosis not present

## 2016-02-25 DIAGNOSIS — Z85828 Personal history of other malignant neoplasm of skin: Secondary | ICD-10-CM | POA: Diagnosis not present

## 2016-03-04 ENCOUNTER — Other Ambulatory Visit: Payer: Self-pay | Admitting: Geriatric Medicine

## 2016-03-04 ENCOUNTER — Ambulatory Visit
Admission: RE | Admit: 2016-03-04 | Discharge: 2016-03-04 | Disposition: A | Discharge status: Home / Self Care | Payer: Medicare Other | Source: Ambulatory Visit | Attending: Geriatric Medicine | Admitting: Geriatric Medicine

## 2016-03-04 DIAGNOSIS — J189 Pneumonia, unspecified organism: Secondary | ICD-10-CM

## 2016-03-18 ENCOUNTER — Ambulatory Visit
Admission: RE | Admit: 2016-03-18 | Discharge: 2016-03-18 | Disposition: A | Discharge status: Home / Self Care | Payer: Medicare Other | Source: Ambulatory Visit | Attending: Geriatric Medicine | Admitting: Geriatric Medicine

## 2016-03-18 ENCOUNTER — Other Ambulatory Visit: Payer: Self-pay | Admitting: Geriatric Medicine

## 2016-03-18 DIAGNOSIS — R05 Cough: Secondary | ICD-10-CM | POA: Diagnosis not present

## 2016-03-18 DIAGNOSIS — J9811 Atelectasis: Secondary | ICD-10-CM

## 2016-03-27 DIAGNOSIS — Z23 Encounter for immunization: Secondary | ICD-10-CM | POA: Diagnosis not present

## 2016-04-14 ENCOUNTER — Other Ambulatory Visit: Payer: Self-pay | Admitting: Geriatric Medicine

## 2016-04-14 ENCOUNTER — Ambulatory Visit
Admission: RE | Admit: 2016-04-14 | Discharge: 2016-04-14 | Disposition: A | Discharge status: Home / Self Care | Payer: Medicare Other | Source: Ambulatory Visit | Attending: Geriatric Medicine | Admitting: Geriatric Medicine

## 2016-04-14 DIAGNOSIS — J9811 Atelectasis: Secondary | ICD-10-CM

## 2016-04-14 DIAGNOSIS — I10 Essential (primary) hypertension: Secondary | ICD-10-CM | POA: Diagnosis not present

## 2016-04-14 DIAGNOSIS — G301 Alzheimer's disease with late onset: Secondary | ICD-10-CM | POA: Diagnosis not present

## 2016-04-14 DIAGNOSIS — Z7189 Other specified counseling: Secondary | ICD-10-CM | POA: Diagnosis not present

## 2016-04-14 DIAGNOSIS — F0280 Dementia in other diseases classified elsewhere without behavioral disturbance: Secondary | ICD-10-CM | POA: Diagnosis not present

## 2016-04-14 DIAGNOSIS — E46 Unspecified protein-calorie malnutrition: Secondary | ICD-10-CM | POA: Diagnosis not present

## 2016-04-14 DIAGNOSIS — Z0000 Encounter for general adult medical examination without abnormal findings: Secondary | ICD-10-CM | POA: Diagnosis not present

## 2016-04-14 DIAGNOSIS — Z1389 Encounter for screening for other disorder: Secondary | ICD-10-CM | POA: Diagnosis not present

## 2016-04-24 DIAGNOSIS — H02204 Unspecified lagophthalmos left upper eyelid: Secondary | ICD-10-CM | POA: Diagnosis not present

## 2016-04-24 DIAGNOSIS — H02201 Unspecified lagophthalmos right upper eyelid: Secondary | ICD-10-CM | POA: Diagnosis not present

## 2016-04-24 DIAGNOSIS — H02202 Unspecified lagophthalmos right lower eyelid: Secondary | ICD-10-CM | POA: Diagnosis not present

## 2016-04-24 DIAGNOSIS — H02055 Trichiasis without entropian left lower eyelid: Secondary | ICD-10-CM | POA: Diagnosis not present

## 2016-04-24 DIAGNOSIS — H04123 Dry eye syndrome of bilateral lacrimal glands: Secondary | ICD-10-CM | POA: Diagnosis not present

## 2016-04-28 DIAGNOSIS — H401132 Primary open-angle glaucoma, bilateral, moderate stage: Secondary | ICD-10-CM | POA: Diagnosis not present

## 2016-05-20 DIAGNOSIS — H401132 Primary open-angle glaucoma, bilateral, moderate stage: Secondary | ICD-10-CM | POA: Diagnosis not present

## 2016-10-06 DIAGNOSIS — I10 Essential (primary) hypertension: Secondary | ICD-10-CM | POA: Diagnosis not present

## 2016-10-06 DIAGNOSIS — G301 Alzheimer's disease with late onset: Secondary | ICD-10-CM | POA: Diagnosis not present

## 2016-10-06 DIAGNOSIS — R69 Illness, unspecified: Secondary | ICD-10-CM | POA: Diagnosis not present

## 2016-10-13 DIAGNOSIS — H401132 Primary open-angle glaucoma, bilateral, moderate stage: Secondary | ICD-10-CM | POA: Diagnosis not present

## 2016-10-13 DIAGNOSIS — H02055 Trichiasis without entropian left lower eyelid: Secondary | ICD-10-CM | POA: Diagnosis not present

## 2016-10-27 DIAGNOSIS — H401132 Primary open-angle glaucoma, bilateral, moderate stage: Secondary | ICD-10-CM | POA: Diagnosis not present

## 2016-10-27 DIAGNOSIS — H02055 Trichiasis without entropian left lower eyelid: Secondary | ICD-10-CM | POA: Diagnosis not present

## 2016-11-05 DIAGNOSIS — R413 Other amnesia: Secondary | ICD-10-CM | POA: Diagnosis not present

## 2016-11-14 ENCOUNTER — Emergency Department (HOSPITAL_COMMUNITY): Payer: Medicare HMO

## 2016-11-14 ENCOUNTER — Emergency Department (HOSPITAL_COMMUNITY)
Admission: EM | Admit: 2016-11-14 | Discharge: 2016-11-14 | Disposition: A | Discharge status: Home / Self Care | Payer: Medicare HMO | Attending: Emergency Medicine | Admitting: Emergency Medicine

## 2016-11-14 ENCOUNTER — Encounter (HOSPITAL_COMMUNITY): Payer: Self-pay

## 2016-11-14 DIAGNOSIS — S32010A Wedge compression fracture of first lumbar vertebra, initial encounter for closed fracture: Secondary | ICD-10-CM | POA: Diagnosis not present

## 2016-11-14 DIAGNOSIS — S7002XA Contusion of left hip, initial encounter: Secondary | ICD-10-CM | POA: Diagnosis not present

## 2016-11-14 DIAGNOSIS — M25559 Pain in unspecified hip: Secondary | ICD-10-CM

## 2016-11-14 DIAGNOSIS — F0281 Dementia in other diseases classified elsewhere with behavioral disturbance: Secondary | ICD-10-CM | POA: Insufficient documentation

## 2016-11-14 DIAGNOSIS — Z7982 Long term (current) use of aspirin: Secondary | ICD-10-CM | POA: Diagnosis not present

## 2016-11-14 DIAGNOSIS — Y9301 Activity, walking, marching and hiking: Secondary | ICD-10-CM | POA: Insufficient documentation

## 2016-11-14 DIAGNOSIS — R402441 Other coma, without documented Glasgow coma scale score, or with partial score reported, in the field [EMT or ambulance]: Secondary | ICD-10-CM | POA: Diagnosis not present

## 2016-11-14 DIAGNOSIS — Y929 Unspecified place or not applicable: Secondary | ICD-10-CM | POA: Insufficient documentation

## 2016-11-14 DIAGNOSIS — S3993XA Unspecified injury of pelvis, initial encounter: Secondary | ICD-10-CM | POA: Diagnosis not present

## 2016-11-14 DIAGNOSIS — I10 Essential (primary) hypertension: Secondary | ICD-10-CM | POA: Insufficient documentation

## 2016-11-14 DIAGNOSIS — W19XXXA Unspecified fall, initial encounter: Secondary | ICD-10-CM

## 2016-11-14 DIAGNOSIS — R69 Illness, unspecified: Secondary | ICD-10-CM | POA: Diagnosis not present

## 2016-11-14 DIAGNOSIS — S79912A Unspecified injury of left hip, initial encounter: Secondary | ICD-10-CM | POA: Diagnosis not present

## 2016-11-14 DIAGNOSIS — W1830XA Fall on same level, unspecified, initial encounter: Secondary | ICD-10-CM | POA: Diagnosis not present

## 2016-11-14 DIAGNOSIS — G309 Alzheimer's disease, unspecified: Secondary | ICD-10-CM | POA: Insufficient documentation

## 2016-11-14 DIAGNOSIS — Y999 Unspecified external cause status: Secondary | ICD-10-CM | POA: Diagnosis not present

## 2016-11-14 LAB — URINALYSIS, ROUTINE W REFLEX MICROSCOPIC
BACTERIA UA: NONE SEEN
BILIRUBIN URINE: NEGATIVE
Glucose, UA: NEGATIVE mg/dL
Ketones, ur: 20 mg/dL — AB
LEUKOCYTES UA: NEGATIVE
NITRITE: NEGATIVE
PROTEIN: NEGATIVE mg/dL
Specific Gravity, Urine: 1.008 (ref 1.005–1.030)
Squamous Epithelial / LPF: NONE SEEN
pH: 7 (ref 5.0–8.0)

## 2016-11-14 MED ORDER — ACETAMINOPHEN 325 MG PO TABS
650.0000 mg | ORAL_TABLET | Freq: Once | ORAL | Status: AC
  Administered 2016-11-14: 650 mg via ORAL
  Filled 2016-11-14: qty 2

## 2016-11-14 NOTE — Discharge Instructions (Signed)
Stop taking Remeron until seen by your doctor.

## 2016-11-14 NOTE — ED Notes (Signed)
Patient transported to X-ray 

## 2016-11-14 NOTE — ED Notes (Signed)
Patient ambulated in the hallway with minimal assistance. Family thought she was favoring the left hip/leg.

## 2016-11-14 NOTE — ED Triage Notes (Signed)
Per EMS- Patient fell while walking to the bathroom at 0430 today. Patient's husband reports that the patient fell on her buttocks and yelled out when he assisted her back to bed. EMS states patient did not answer questions regarding pain. Patient has a history of Alzheimer's.  Patient's husband denies LOC or hitting her head on the floor.

## 2016-11-14 NOTE — ED Provider Notes (Signed)
[facility] DEPT Provider Note   CSN: [id number] Arrival date & time: 11/14/16  3532     History   Chief Complaint Chief Complaint  Patient presents with  . Fall    HPI Natasha David is a 83 y.o. female.  Pt has a hx of alzheimer's dementia.  This morning, pt tried to get up to go to the bathroom and fell around 0430.  The pt would not let her husband get her up, so she scooted to the bed.  Finally, she let her husband help her because she could not get onto the mattress which was on the floor.  (Family has put her mattress on the floor due to multiple falls).  When pt's husband got her up this morning to use the bathroom, she was still limping and seemed to be in pain.  Pt unable to tell me where she hurts.  Family also reports that pt escaped from the house the night before last and was found on the road.  Pt's behavior has also been worse than normal.  Family said this started after starting remeron to help her sleep.      Past Medical History:  Diagnosis Date  . Allergic rhinitis   . Chronic cough    evaluated by Dr. Baird Lyons  . Costochondritis   . Depression with somatization   . GERD (gastroesophageal reflux disease)   . Glaucoma   . Hypercholesterolemia    goal  LDL cholesterol less than 130  . Hypertension   . LBBB (left bundle branch block)   . Memory loss    15/30 on 04-2013 probable Alzheimer's   . Osteoporosis   . SIADH (syndrome of inappropriate ADH production) ([place])    sodium 124-128    Patient Active Problem List   Diagnosis Date Noted  . Vertigo 10/18/2013  . Hyponatremia 03/05/2013  . Dizziness and giddiness 03/05/2013  . Accelerated hypertension 03/05/2013  . Glaucoma     Past Surgical History:  Procedure Laterality Date  . ABDOMINAL SURGERY     stomach wrap for reflux, surgery for "locked bowels"  . APPENDECTOMY    . CATARACT EXTRACTION    . COLONOSCOPY  01-2002  . US ECHOCARDIOGRAPHY  04-2009   Moderate aortic and mitral regurg      OB History    No data available       Home Medications    Prior to Admission medications   Medication Sig Start Date End Date Taking? Authorizing Provider  aspirin EC 81 MG tablet Take 81 mg by mouth every morning.    [provider]  bimatoprost (LUMIGAN) 0.01 % SOLN Place 1 drop into both eyes at bedtime.     [provider]  calcium-vitamin D (OSCAL WITH D) 500-200 MG-UNIT per tablet Take 1 tablet by mouth every morning.    [provider]  dorzolamide-timolol (COSOPT) 22.3-6.8 MG/ML ophthalmic solution Place 1 drop into both eyes 2 (two) times daily.    [provider]  guaiFENesin-dextromethorphan (ROBITUSSIN DM) 100-10 MG/5ML syrup Take 5 mLs by mouth every 4 (four) hours as needed for cough.    [provider]  Multiple Vitamin (MULTIVITAMIN WITH MINERALS) TABS tablet Take 1 tablet by mouth daily.    [provider]  traMADol (ULTRAM) 50 MG tablet Take one every 6 hours for pain not helped by tylenol or motrin Patient not taking: Reported on 09/18/2015 05/12/15   Milton Ferguson, MD    Family History Family History  Problem  Relation Age of Onset  . Family history unknown: Yes    Social History Social History  Substance Use Topics  . Smoking status: Never Smoker  . Smokeless tobacco: Never Used  . Alcohol use No     Allergies   Amoxicillin   Review of Systems Review of Systems  Unable to perform ROS: Dementia  All other systems reviewed and are negative.    Physical Exam Updated Vital Signs BP (!) 155/53 (BP Location: Right Arm)   Pulse 75   Resp 17   Ht 5' (1.524 m)   Wt 95 lb (43.1 kg)   SpO2 95%   BMI 18.55 kg/m   Physical Exam  Constitutional: She appears well-developed and well-nourished.  HENT:  Head: Normocephalic and atraumatic.  Right Ear: External ear normal.  Left Ear: External ear normal.  Nose: Nose normal.  Mouth/Throat: Oropharynx is clear and moist.  Eyes: Conjunctivae and  EOM are normal. Pupils are equal, round, and reactive to light.  Neck: Normal range of motion. Neck supple.  Cardiovascular: Normal rate, regular rhythm, normal heart sounds and intact distal pulses.   Pulmonary/Chest: Effort normal and breath sounds normal.  Abdominal: Soft. Bowel sounds are normal.  Musculoskeletal: Normal range of motion.  Neurological: She is alert.  Skin: Skin is warm.  Psychiatric: She has a normal mood and affect. Her behavior is normal. Judgment and thought content normal.  Nursing note and vitals reviewed.    ED Treatments / Results  Labs (all labs ordered are listed, but only abnormal results are displayed) Labs Reviewed  URINALYSIS, ROUTINE W REFLEX MICROSCOPIC - Abnormal; Notable for the following:       Result Value   Hgb urine dipstick SMALL (*)    Ketones, ur 20 (*)    All other components within normal limits    EKG  EKG Interpretation None       Radiology Dg Lumbar Spine Complete  Result Date: 11/14/2016 CLINICAL DATA:  Per EMS- Patient fell while walking to the bathroom at 0430 today. Patient's husband reports that the patient fell on her buttocks and yelled out when he assisted her back to bed. History of Alzheimer's. EXAM: LUMBAR SPINE - COMPLETE 4+ VIEW COMPARISON:  Plain film of the lumbar spine dated 05/12/2015. CT abdomen dated 05/12/2015. CT thoracic spine dated 09/18/2015. FINDINGS: The previous plain film report of 05/12/2015 suggests there are 6 lumbar type vertebral bodies. Based on a CT abdomen from the same day, I believe there are 5 lumbar type vertebral bodies. Based on this lumbar spine numbering, there is a new compression fracture deformity of the L1 vertebral body, although this compression fracture appears chronic. Milder compression deformity of the T12 vertebral body is stable prior to the earlier thoracic spine CT. Remainder the lumbar spine vertebral bodies appear intact and normal in height. Mild scoliosis is stable. Mild  degenerative spurring appears stable. Atherosclerotic changes are seen along the walls of the abdominal aorta. Visualized paravertebral soft tissues are otherwise unremarkable. Upper sacrum appears intact and normally aligned. IMPRESSION: 1. New compression fracture deformity of the L1 vertebral body compared to thoracic spine CT of 09/18/2015, approximately 25-30% compressed anteriorly, without significant retropulsion. However, this L1 vertebral body compression fracture is more likely chronic based on its appearance and likely has occurred earlier in the interval since the thoracic spine CT. 2. Milder chronic appearing compression fracture deformity of the T12 vertebral body, as also described on earlier thoracic spine CT. 3. No acute appearing findings. 4.  Aortic atherosclerosis. Electronically Signed   By: Franki Cabot M.D.   On: 11/14/2016 09:50   Dg Pelvis 1-2 Views  Result Date: 11/14/2016 CLINICAL DATA:  Per EMS- Patient fell while walking to the bathroom at 0430 today. Patient's husband reports that the patient fell on her buttocks and yelled out when he assisted her back to bed. History of Alzheimer's. EXAM: PELVIS - 1-2 VIEW COMPARISON:  None. FINDINGS: There is no evidence of pelvic fracture or diastasis. No pelvic bone lesions are seen. Mild degenerative spurring noted in the lower thoracic spine. Fairly large amount of stool within the colon. Soft tissues about the pelvis are otherwise unremarkable. IMPRESSION: 1. No osseous fracture or dislocation. 2. Fairly large amount of stool within the visualized portions of the colon suggesting constipation. Electronically Signed   By: Franki Cabot M.D.   On: 11/14/2016 09:39    Procedures Procedures (including critical care time)  Medications Ordered in ED Medications  acetaminophen (TYLENOL) tablet 650 mg (650 mg Oral Given 11/14/16 0955)     Initial Impression / Assessment and Plan / ED Course  I have reviewed the triage vital signs and the  nursing notes.  Pertinent labs & imaging results that were available during my care of the patient were reviewed by me and considered in my medical decision making (see chart for details).    Pt able to ambulate, but still has pain in her hip.  CT done which shows no fx.  Pt's family told to hold remeron as behavior changes started after starting this med.  They are told to notify pcp.  Return if worse.  Final Clinical Impressions(s) / ED Diagnoses   Final diagnoses:  Fall  Hip pain  Fall, initial encounter  Contusion of left hip, initial encounter  Alzheimer's dementia with behavioral disturbance, unspecified timing of dementia onset    New Prescriptions New Prescriptions   No medications on file     Isla Pence, MD 11/14/16 1325

## 2016-11-14 NOTE — ED Notes (Signed)
Bed: [facility] Expected date:  Expected time:  Means of arrival:  Comments: Fall- Alzheimer's

## 2017-01-05 DIAGNOSIS — R413 Other amnesia: Secondary | ICD-10-CM | POA: Diagnosis not present

## 2017-02-09 DIAGNOSIS — R413 Other amnesia: Secondary | ICD-10-CM | POA: Diagnosis not present

## 2017-03-10 DIAGNOSIS — R413 Other amnesia: Secondary | ICD-10-CM | POA: Diagnosis not present

## 2017-04-06 DIAGNOSIS — R413 Other amnesia: Secondary | ICD-10-CM | POA: Diagnosis not present

## 2017-05-18 DIAGNOSIS — R413 Other amnesia: Secondary | ICD-10-CM | POA: Diagnosis not present

## 2017-06-10 DIAGNOSIS — R413 Other amnesia: Secondary | ICD-10-CM | POA: Diagnosis not present

## 2017-07-08 DIAGNOSIS — R413 Other amnesia: Secondary | ICD-10-CM | POA: Diagnosis not present

## 2017-07-10 IMAGING — CT CT T SPINE W/O CM
2 of 4 series · 12 of 33 positions shown, 15 images · non-contrast
Comparison: Chest x-ray 09/18/2015, CT chest 03/19/2015

CLINICAL DATA: [AGE] female with a history of fall and
dementia.

Chest x-ray suggests new compression fracture of T12.
EXAM:
CT THORACIC SPINE WITHOUT CONTRAST
TECHNIQUE: Multidetector CT imaging of the thoracic spine was performed without
intravenous contrast administration. Multiplanar CT image
reconstructions were also generated.

[Series 3: t-spine st · axial · 0.28mm/px · z∈[+1256,+1500]mm · 9 of 146 slices shown, 12 images]
[im 12/146  soft-tissue]
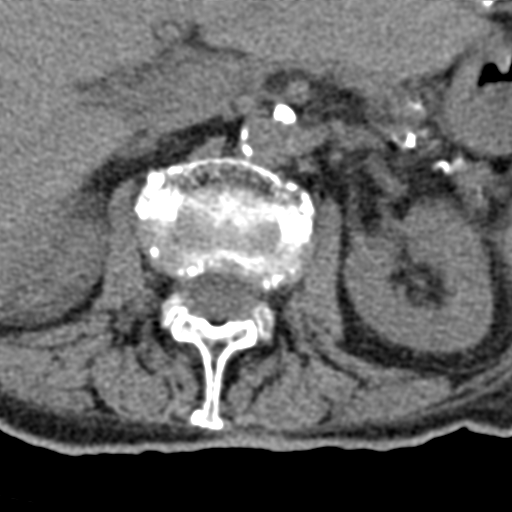
[im 12/146  bone]
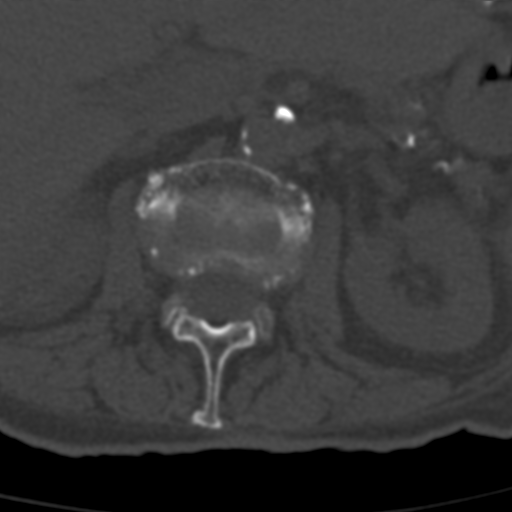
[im 34/146  bone]
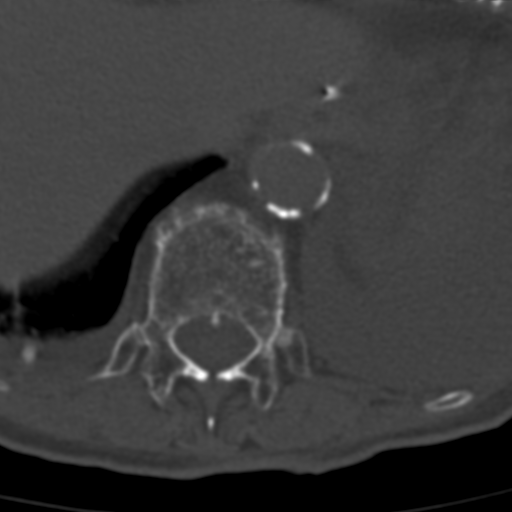
[im 45/146  bone]
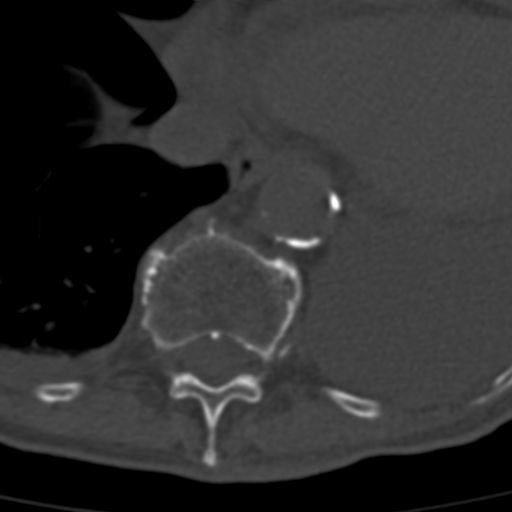
[im 56/146  bone]
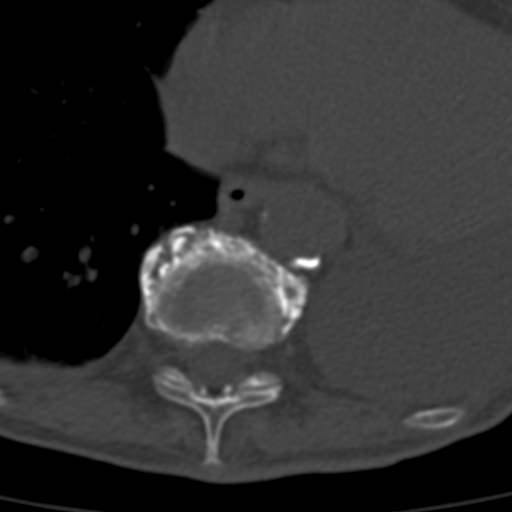
[im 79/146  soft-tissue]
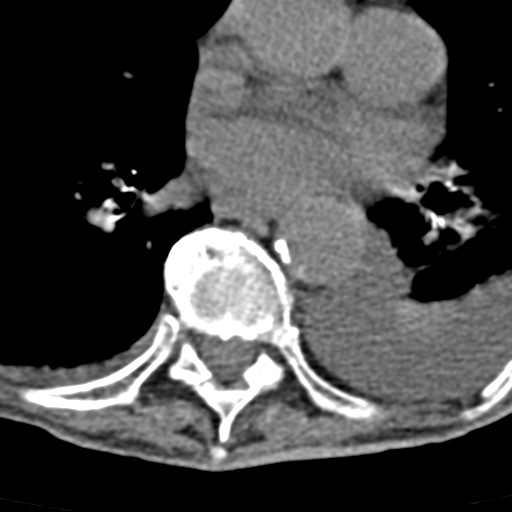
[im 79/146  bone]
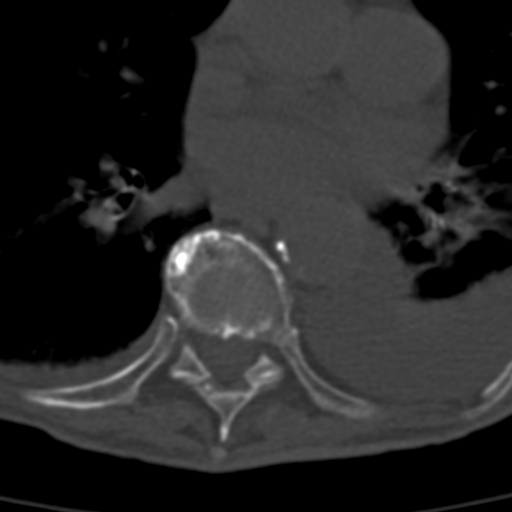
[im 90/146  bone]
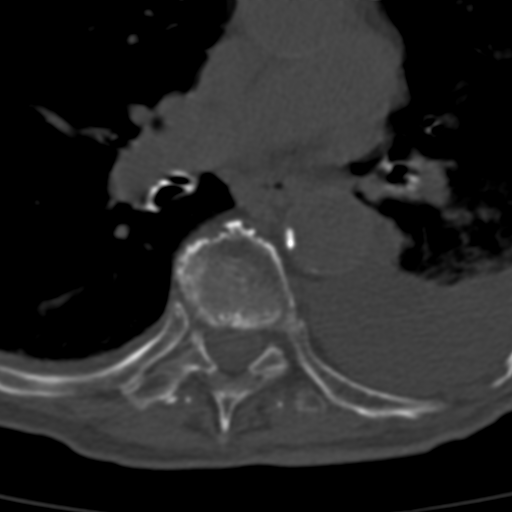
[im 101/146  bone]
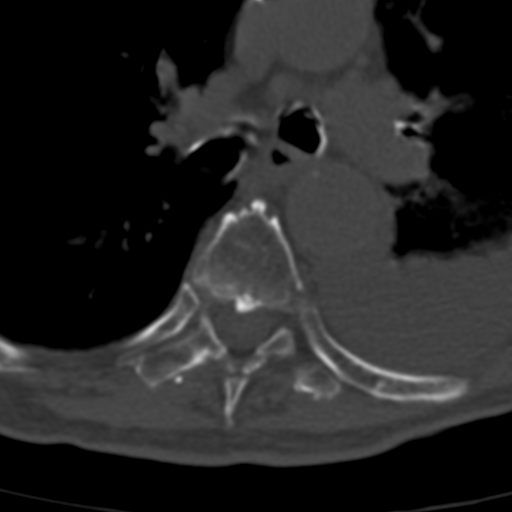
[im 123/146  bone]
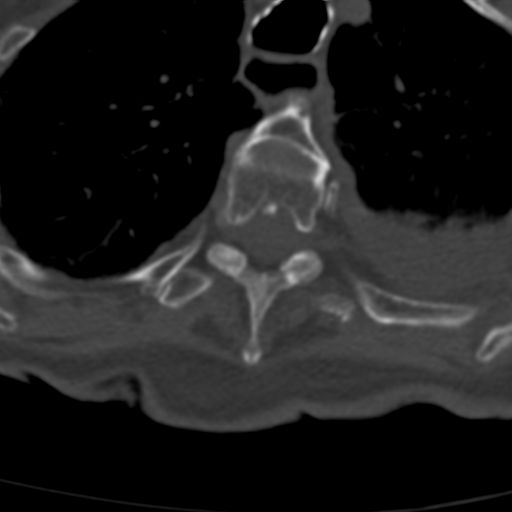
[im 134/146  soft-tissue]
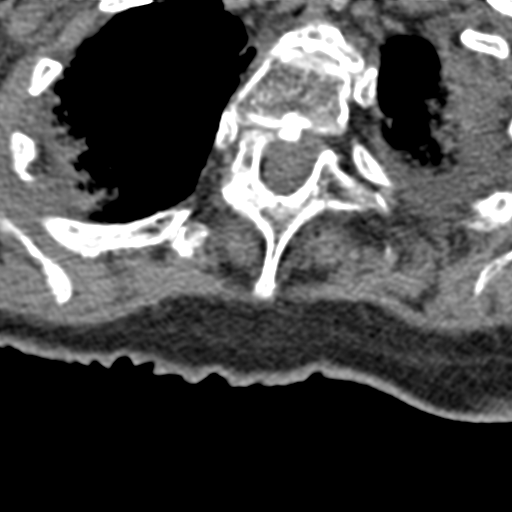
[im 134/146  bone]
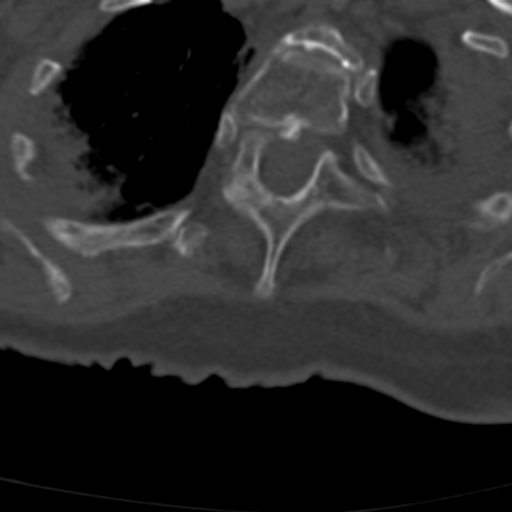

[Series 5: coronal · coronal · 0.23mm/px · 3 of 90 slices shown]
[im 2/90  bone]
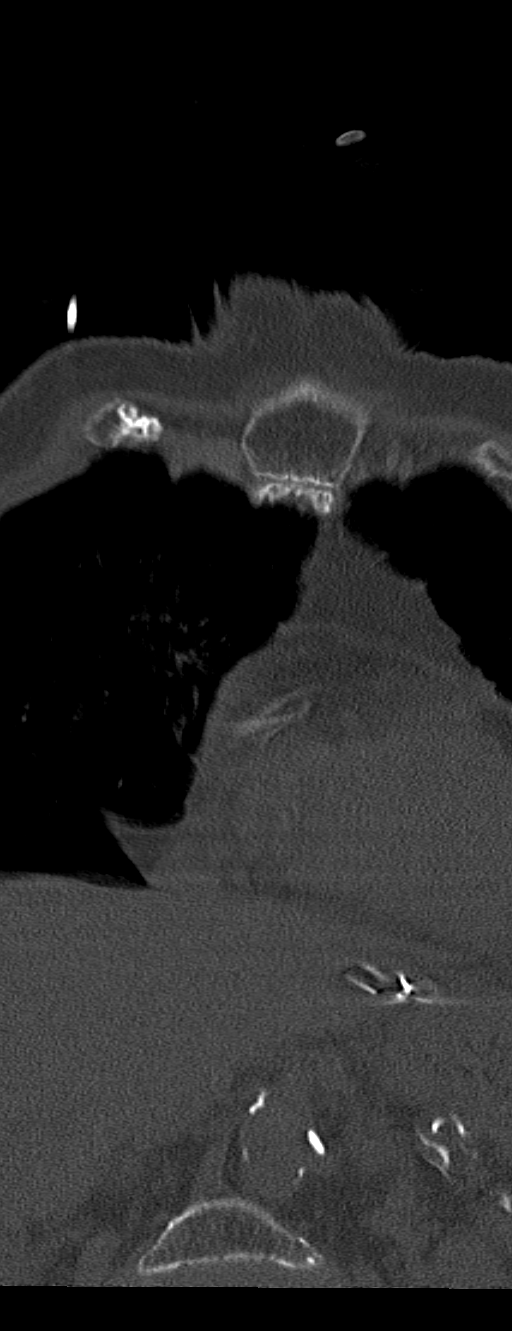
[im 34/90  bone]
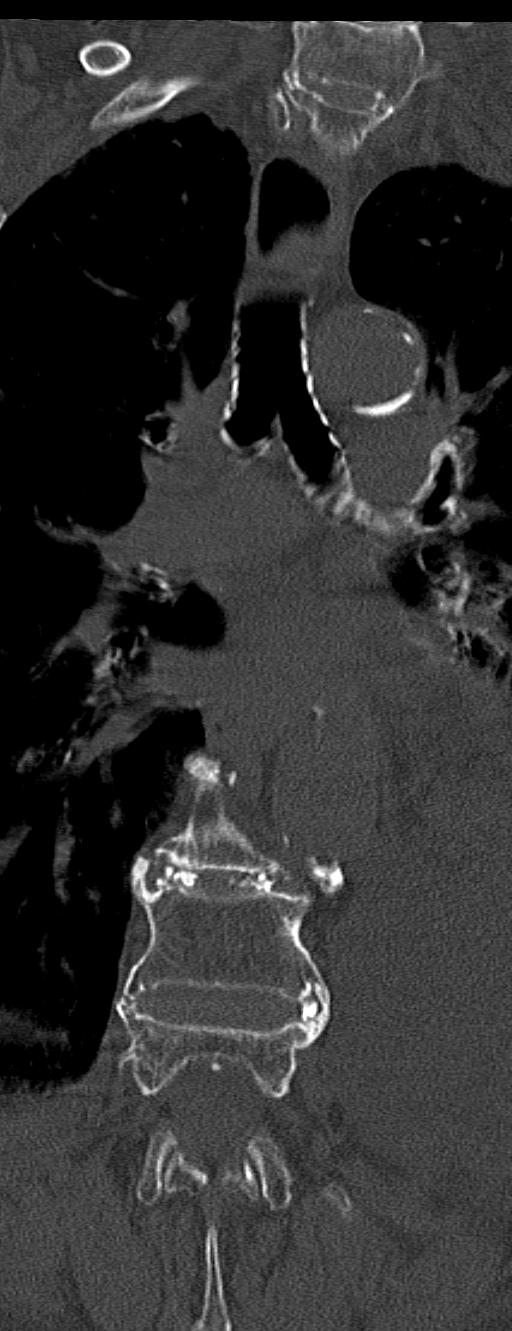
[im 66/90  bone]
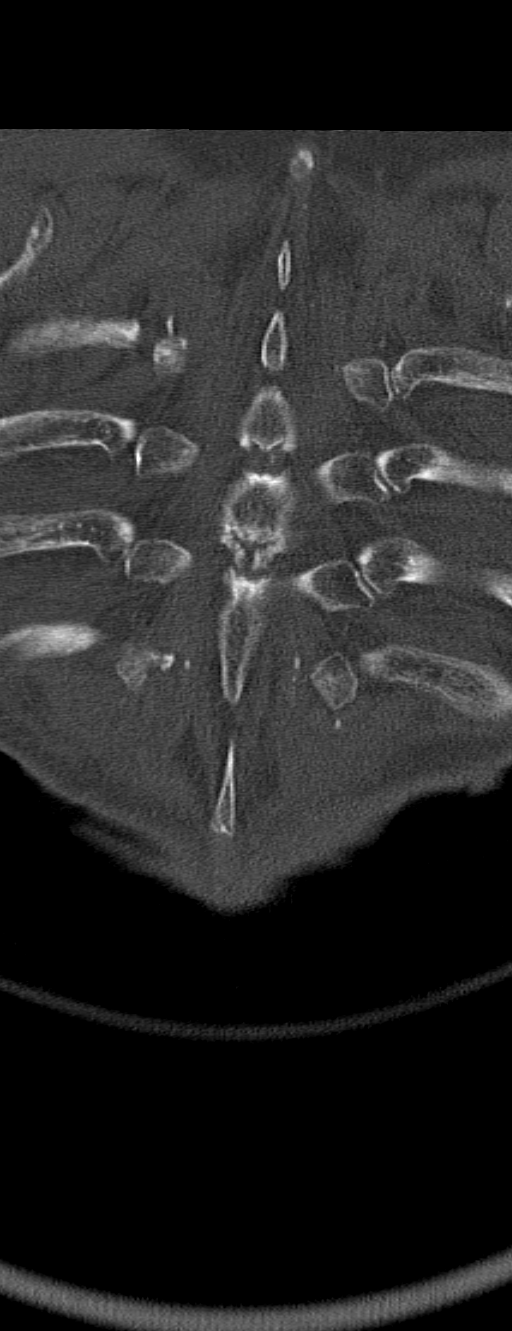

[12 of 33 positions shown; findings below may reference images not displayed]

FINDINGS: Kyphotic curvature of the thoracic spine.  No subluxation.

Facets maintain alignment.

New compression fracture of the T12 level compared to the chest CT
March 2015. Less than 30% anterior height loss. Minimal
retropulsion of the posterior endplate without significant canal
narrowing. Sclerotic changes along the superior endplate.

Multilevel degenerative disc disease with anterior osteophyte
production throughout the thoracic spine.

Left pleural effusion with associated atelectasis.

Native coronary vascular calcifications. Calcifications of the
thoracic aorta.
IMPRESSION: New compression fracture of T12 from March 2015, with less than
30% anterior height loss and no compromise of the canal. Given the
sclerotic appearance, this is favored to be chronic, however, any
subacute/unhealed component could only be assessed with MRI. If the
patient has point tenderness at this location or ongoing symptoms,
referral for evaluation may be considered.

Left pleural effusion and atelectasis.

## 2017-07-10 IMAGING — CR DG CHEST 2V
2 series · 2 of 2 positions shown · non-contrast
Comparison: Chest radiograph and chest CT March 19, 2015

CLINICAL DATA: Pain following fall.

EXAM:
CHEST  2 VIEW

[x chest ap]
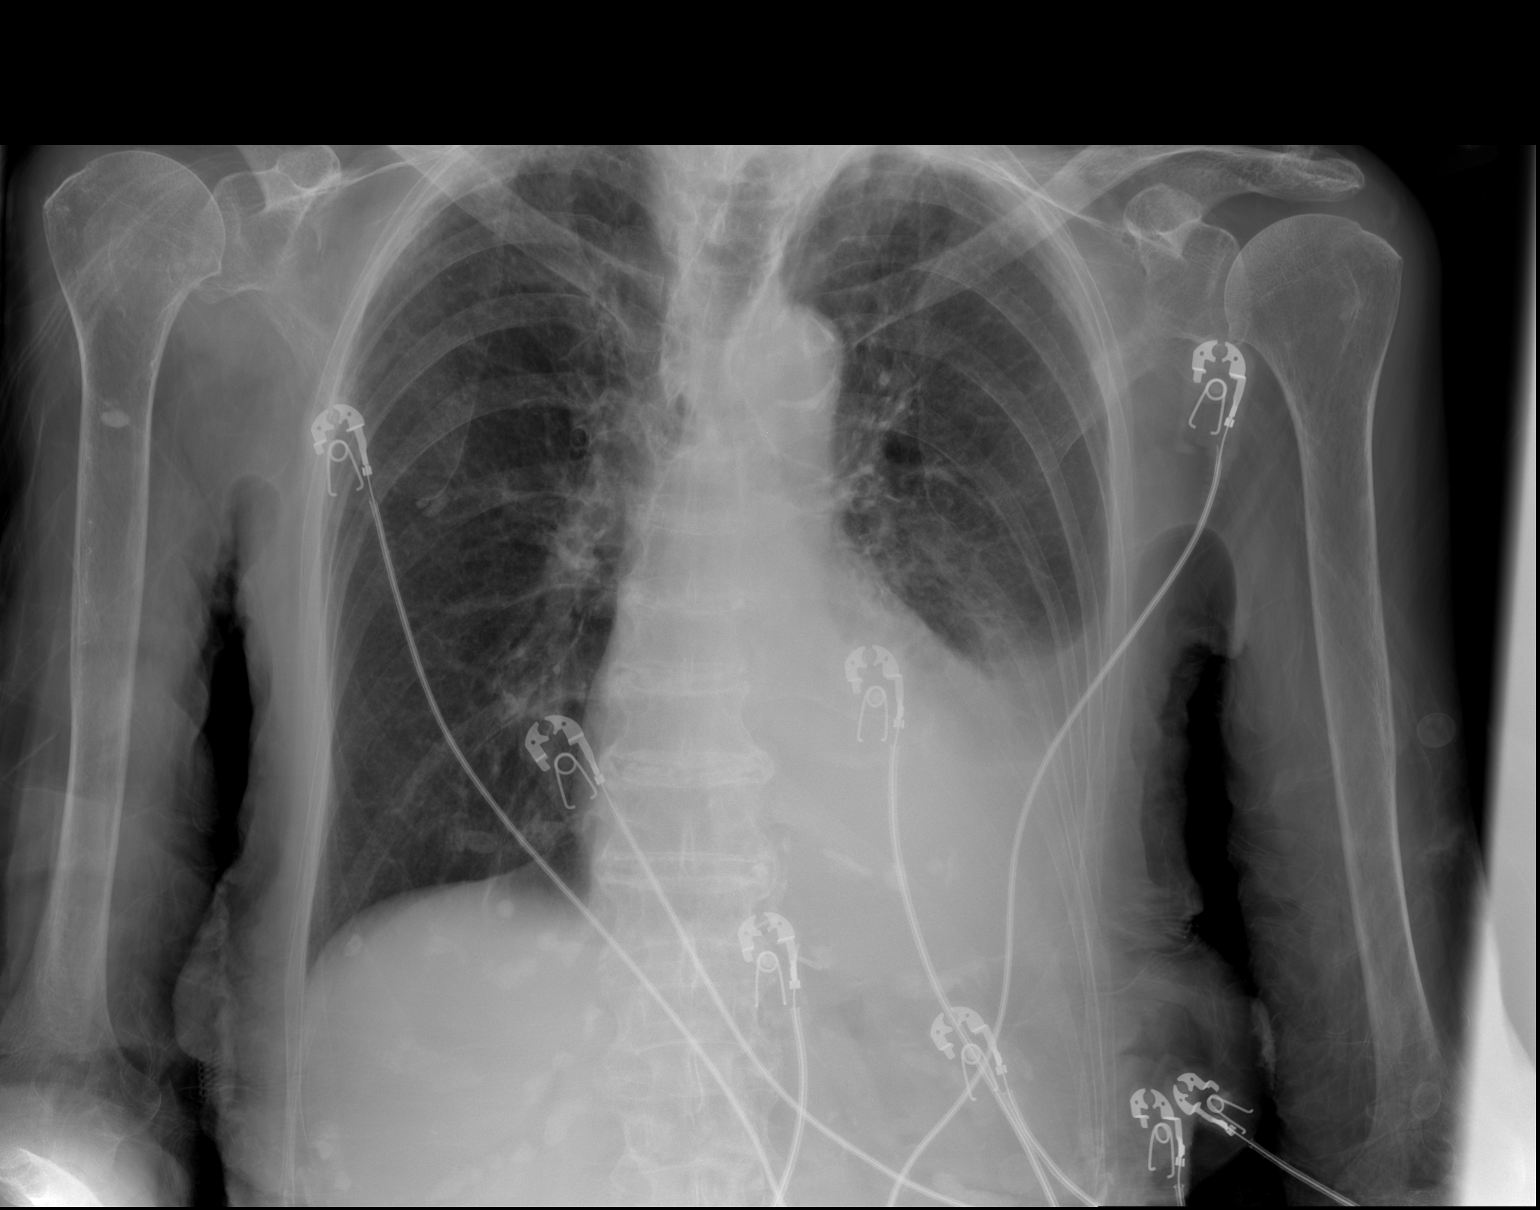

[w chest lat]
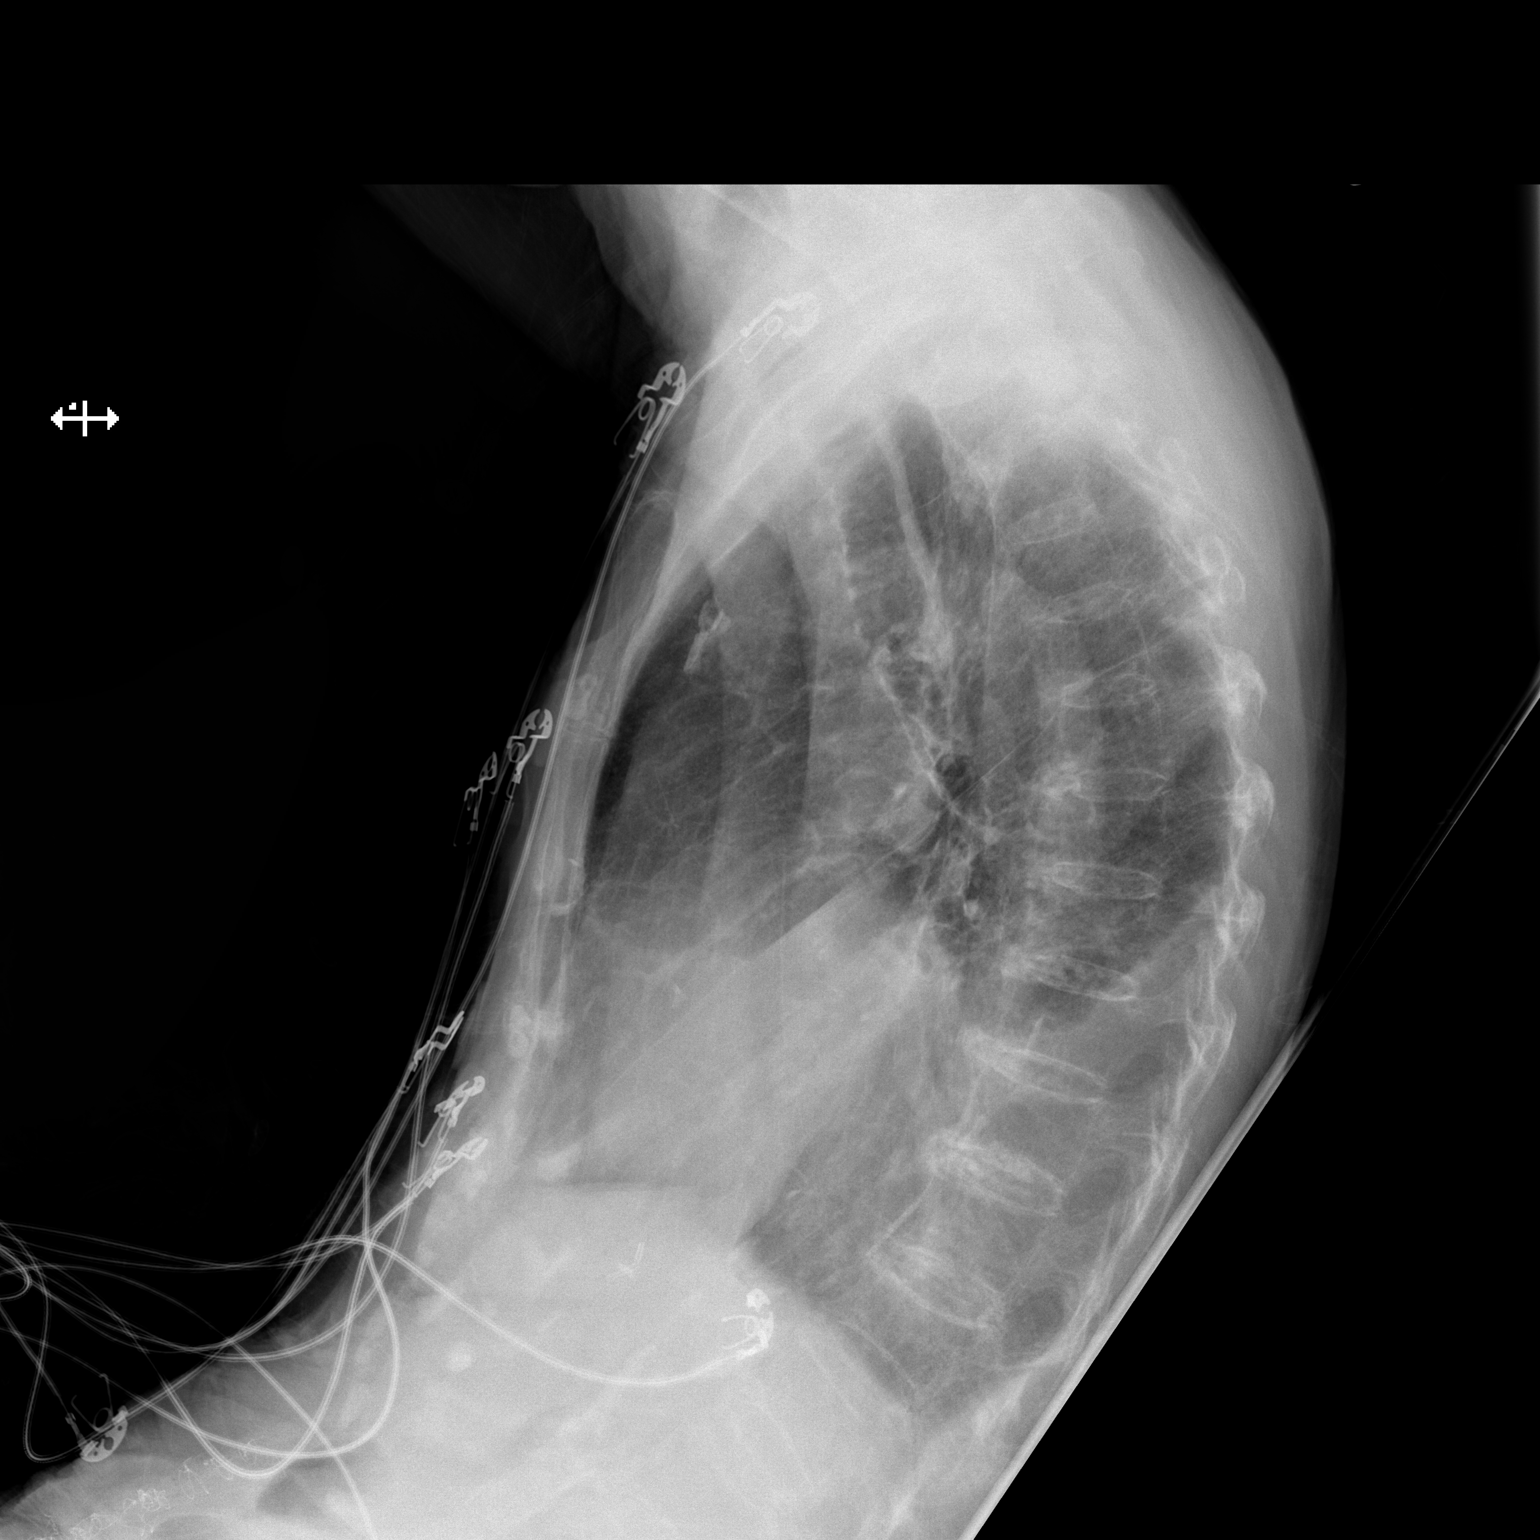

[2 of 2 positions shown; findings below may reference images not displayed]

FINDINGS: There is a stable persistent sizable pleural effusion on the left
with left lower lobe and lingular atelectatic change. Lungs
elsewhere clear. Heart is upper normal in size with pulmonary
vascularity within normal limits. There is atherosclerotic
calcification in the aorta. No adenopathy evident. No pneumothorax.
Bones are osteoporotic. There is anterior wedging of the T12
vertebral body, not present prior.
IMPRESSION: Sizable pleural effusion on the left with atelectatic change on the
left, stable from prior studies. Lungs elsewhere clear. No
pneumothorax.

Age uncertain anterior wedging of the T12 vertebral body, not
present on most recent available prior studies.

## 2017-08-05 DIAGNOSIS — R413 Other amnesia: Secondary | ICD-10-CM | POA: Diagnosis not present

## 2017-09-21 DIAGNOSIS — R4182 Altered mental status, unspecified: Secondary | ICD-10-CM | POA: Diagnosis not present

## 2017-09-21 DIAGNOSIS — Z515 Encounter for palliative care: Secondary | ICD-10-CM | POA: Diagnosis not present

## 2017-10-04 DEATH — deceased
